# Patient Record
Sex: Male | Born: 1955
Health system: Southern US, Community
[De-identification: ages and names within clinical notes are randomized; demographics above are authoritative.]

## PROBLEM LIST (undated history)

## (undated) DIAGNOSIS — I10 Essential (primary) hypertension: Secondary | ICD-10-CM

## (undated) DIAGNOSIS — F32A Depression, unspecified: Secondary | ICD-10-CM

## (undated) DIAGNOSIS — C439 Malignant melanoma of skin, unspecified: Secondary | ICD-10-CM

## (undated) DIAGNOSIS — F329 Major depressive disorder, single episode, unspecified: Secondary | ICD-10-CM

## (undated) HISTORY — DX: Malignant melanoma of skin, unspecified: C43.9

---

## 2016-06-02 DIAGNOSIS — H2513 Age-related nuclear cataract, bilateral: Secondary | ICD-10-CM | POA: Diagnosis not present

## 2016-06-02 DIAGNOSIS — S00202A Unspecified superficial injury of left eyelid and periocular area, initial encounter: Secondary | ICD-10-CM | POA: Diagnosis not present

## 2016-06-08 DIAGNOSIS — F419 Anxiety disorder, unspecified: Secondary | ICD-10-CM | POA: Diagnosis not present

## 2016-06-08 DIAGNOSIS — I10 Essential (primary) hypertension: Secondary | ICD-10-CM | POA: Diagnosis not present

## 2016-06-08 DIAGNOSIS — H538 Other visual disturbances: Secondary | ICD-10-CM | POA: Diagnosis not present

## 2016-06-24 DIAGNOSIS — I10 Essential (primary) hypertension: Secondary | ICD-10-CM | POA: Diagnosis not present

## 2016-06-24 DIAGNOSIS — R7989 Other specified abnormal findings of blood chemistry: Secondary | ICD-10-CM | POA: Diagnosis not present

## 2016-06-24 DIAGNOSIS — R946 Abnormal results of thyroid function studies: Secondary | ICD-10-CM | POA: Diagnosis not present

## 2016-06-24 DIAGNOSIS — F419 Anxiety disorder, unspecified: Secondary | ICD-10-CM | POA: Diagnosis not present

## 2016-08-07 DIAGNOSIS — I10 Essential (primary) hypertension: Secondary | ICD-10-CM | POA: Diagnosis not present

## 2016-08-07 DIAGNOSIS — R946 Abnormal results of thyroid function studies: Secondary | ICD-10-CM | POA: Diagnosis not present

## 2016-08-07 DIAGNOSIS — R945 Abnormal results of liver function studies: Secondary | ICD-10-CM | POA: Diagnosis not present

## 2016-08-07 DIAGNOSIS — F1010 Alcohol abuse, uncomplicated: Secondary | ICD-10-CM | POA: Diagnosis not present

## 2016-08-07 DIAGNOSIS — F419 Anxiety disorder, unspecified: Secondary | ICD-10-CM | POA: Diagnosis not present

## 2016-09-22 ENCOUNTER — Encounter (HOSPITAL_COMMUNITY): Payer: Self-pay | Admitting: Emergency Medicine

## 2016-09-22 ENCOUNTER — Inpatient Hospital Stay (HOSPITAL_COMMUNITY)
Admission: EM | Admit: 2016-09-22 | Discharge: 2016-09-25 | DRG: 896 | Disposition: A | Discharge status: Home / Self Care | Payer: BLUE CROSS/BLUE SHIELD | Attending: Internal Medicine | Admitting: Internal Medicine

## 2016-09-22 DIAGNOSIS — F32A Depression, unspecified: Secondary | ICD-10-CM | POA: Diagnosis present

## 2016-09-22 DIAGNOSIS — R45851 Suicidal ideations: Secondary | ICD-10-CM

## 2016-09-22 DIAGNOSIS — E860 Dehydration: Secondary | ICD-10-CM

## 2016-09-22 DIAGNOSIS — F329 Major depressive disorder, single episode, unspecified: Secondary | ICD-10-CM | POA: Diagnosis present

## 2016-09-22 DIAGNOSIS — Y908 Blood alcohol level of 240 mg/100 ml or more: Secondary | ICD-10-CM | POA: Diagnosis present

## 2016-09-22 DIAGNOSIS — F10229 Alcohol dependence with intoxication, unspecified: Secondary | ICD-10-CM | POA: Diagnosis not present

## 2016-09-22 DIAGNOSIS — E872 Acidosis: Secondary | ICD-10-CM | POA: Diagnosis present

## 2016-09-22 DIAGNOSIS — F1010 Alcohol abuse, uncomplicated: Secondary | ICD-10-CM | POA: Diagnosis present

## 2016-09-22 DIAGNOSIS — Z653 Problems related to other legal circumstances: Secondary | ICD-10-CM

## 2016-09-22 DIAGNOSIS — Z79899 Other long term (current) drug therapy: Secondary | ICD-10-CM

## 2016-09-22 DIAGNOSIS — M6282 Rhabdomyolysis: Secondary | ICD-10-CM | POA: Diagnosis present

## 2016-09-22 DIAGNOSIS — I10 Essential (primary) hypertension: Secondary | ICD-10-CM | POA: Diagnosis present

## 2016-09-22 DIAGNOSIS — G9340 Encephalopathy, unspecified: Secondary | ICD-10-CM

## 2016-09-22 DIAGNOSIS — G92 Toxic encephalopathy: Secondary | ICD-10-CM | POA: Diagnosis present

## 2016-09-22 DIAGNOSIS — Z811 Family history of alcohol abuse and dependence: Secondary | ICD-10-CM

## 2016-09-22 DIAGNOSIS — Z818 Family history of other mental and behavioral disorders: Secondary | ICD-10-CM

## 2016-09-22 DIAGNOSIS — F419 Anxiety disorder, unspecified: Secondary | ICD-10-CM | POA: Diagnosis present

## 2016-09-22 DIAGNOSIS — F29 Unspecified psychosis not due to a substance or known physiological condition: Secondary | ICD-10-CM | POA: Diagnosis present

## 2016-09-22 DIAGNOSIS — G929 Unspecified toxic encephalopathy: Secondary | ICD-10-CM | POA: Diagnosis present

## 2016-09-22 DIAGNOSIS — F1024 Alcohol dependence with alcohol-induced mood disorder: Secondary | ICD-10-CM | POA: Diagnosis present

## 2016-09-22 DIAGNOSIS — D649 Anemia, unspecified: Secondary | ICD-10-CM | POA: Diagnosis present

## 2016-09-22 HISTORY — DX: Depression, unspecified: F32.A

## 2016-09-22 HISTORY — DX: Major depressive disorder, single episode, unspecified: F32.9

## 2016-09-22 HISTORY — DX: Essential (primary) hypertension: I10

## 2016-09-22 LAB — CBC WITH DIFFERENTIAL/PLATELET
Basophils Absolute: 0.1 10*3/uL (ref 0.0–0.1)
Basophils Relative: 1 %
Eosinophils Absolute: 0 10*3/uL (ref 0.0–0.7)
Eosinophils Relative: 0 %
HCT: 33.8 % — ABNORMAL LOW (ref 39.0–52.0)
Hemoglobin: 11.4 g/dL — ABNORMAL LOW (ref 13.0–17.0)
Lymphocytes Relative: 17 %
Lymphs Abs: 1.4 10*3/uL (ref 0.7–4.0)
MCH: 30.9 pg (ref 26.0–34.0)
MCHC: 33.7 g/dL (ref 30.0–36.0)
MCV: 91.6 fL (ref 78.0–100.0)
Monocytes Absolute: 1.1 10*3/uL — ABNORMAL HIGH (ref 0.1–1.0)
Monocytes Relative: 14 %
Neutro Abs: 5.3 10*3/uL (ref 1.7–7.7)
Neutrophils Relative %: 68 %
Platelets: 197 10*3/uL (ref 150–400)
RBC: 3.69 MIL/uL — ABNORMAL LOW (ref 4.22–5.81)
RDW: 15.3 % (ref 11.5–15.5)
WBC: 7.8 10*3/uL (ref 4.0–10.5)

## 2016-09-22 LAB — COMPREHENSIVE METABOLIC PANEL
ALT: 110 U/L — ABNORMAL HIGH (ref 17–63)
AST: 197 U/L — ABNORMAL HIGH (ref 15–41)
Albumin: 4.3 g/dL (ref 3.5–5.0)
Alkaline Phosphatase: 76 U/L (ref 38–126)
Anion gap: 19 — ABNORMAL HIGH (ref 5–15)
BUN: 10 mg/dL (ref 6–20)
CO2: 16 mmol/L — ABNORMAL LOW (ref 22–32)
Calcium: 8.8 mg/dL — ABNORMAL LOW (ref 8.9–10.3)
Chloride: 104 mmol/L (ref 101–111)
Creatinine, Ser: 0.84 mg/dL (ref 0.61–1.24)
GFR calc Af Amer: >60 mL/min (ref >60)
GFR calc non Af Amer: >60 mL/min (ref >60)
Glucose, Bld: 93 mg/dL (ref 65–99)
Potassium: 3.4 mmol/L — ABNORMAL LOW (ref 3.5–5.1)
Sodium: 139 mmol/L (ref 135–145)
Total Bilirubin: 1.2 mg/dL (ref 0.3–1.2)
Total Protein: 7.5 g/dL (ref 6.5–8.1)

## 2016-09-22 LAB — ETHANOL: Alcohol, Ethyl (B): 327 mg/dL (ref <5)

## 2016-09-22 LAB — SALICYLATE LEVEL: Salicylate Lvl: <7 mg/dL (ref 2.8–30.0)

## 2016-09-22 LAB — CK: Total CK: 1724 U/L — ABNORMAL HIGH (ref 49–397)

## 2016-09-22 LAB — ACETAMINOPHEN LEVEL: Acetaminophen (Tylenol), Serum: <10 ug/mL — ABNORMAL LOW (ref 10–30)

## 2016-09-22 MED ORDER — STERILE WATER FOR INJECTION IJ SOLN
INTRAMUSCULAR | Status: AC
  Administered 2016-09-22: 22:00:00
  Filled 2016-09-22: qty 10

## 2016-09-22 MED ORDER — LORAZEPAM 2 MG/ML IJ SOLN
2.0000 mg | Freq: Once | INTRAMUSCULAR | Status: AC
  Administered 2016-09-22: 2 mg via INTRAMUSCULAR
  Filled 2016-09-22: qty 1

## 2016-09-22 MED ORDER — ZIPRASIDONE MESYLATE 20 MG IM SOLR
10.0000 mg | Freq: Once | INTRAMUSCULAR | Status: AC
  Administered 2016-09-22: 10 mg via INTRAMUSCULAR
  Filled 2016-09-22: qty 20

## 2016-09-22 NOTE — ED Notes (Signed)
Patient is too aggressive at this time to obtain temp or lab work
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
## 2016-09-22 NOTE — ED Notes (Signed)
Bed: WA17 Expected date:  Expected time:  Means of arrival:  Comments: [facility] department
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
## 2016-09-23 ENCOUNTER — Observation Stay (HOSPITAL_COMMUNITY): Payer: BLUE CROSS/BLUE SHIELD

## 2016-09-23 ENCOUNTER — Encounter (HOSPITAL_COMMUNITY): Payer: Self-pay | Admitting: Internal Medicine

## 2016-09-23 DIAGNOSIS — G9340 Encephalopathy, unspecified: Secondary | ICD-10-CM

## 2016-09-23 DIAGNOSIS — F332 Major depressive disorder, recurrent severe without psychotic features: Secondary | ICD-10-CM | POA: Diagnosis not present

## 2016-09-23 DIAGNOSIS — F329 Major depressive disorder, single episode, unspecified: Secondary | ICD-10-CM | POA: Diagnosis present

## 2016-09-23 DIAGNOSIS — M6282 Rhabdomyolysis: Secondary | ICD-10-CM | POA: Diagnosis not present

## 2016-09-23 DIAGNOSIS — R45851 Suicidal ideations: Secondary | ICD-10-CM | POA: Diagnosis not present

## 2016-09-23 DIAGNOSIS — E860 Dehydration: Secondary | ICD-10-CM

## 2016-09-23 DIAGNOSIS — D649 Anemia, unspecified: Secondary | ICD-10-CM | POA: Diagnosis present

## 2016-09-23 DIAGNOSIS — F1024 Alcohol dependence with alcohol-induced mood disorder: Secondary | ICD-10-CM | POA: Diagnosis present

## 2016-09-23 DIAGNOSIS — F32A Depression, unspecified: Secondary | ICD-10-CM | POA: Diagnosis present

## 2016-09-23 DIAGNOSIS — F1010 Alcohol abuse, uncomplicated: Secondary | ICD-10-CM | POA: Diagnosis present

## 2016-09-23 LAB — MRSA PCR SCREENING: MRSA by PCR: NEGATIVE

## 2016-09-23 LAB — URINALYSIS, ROUTINE W REFLEX MICROSCOPIC
Bacteria, UA: NONE SEEN
Bilirubin Urine: NEGATIVE
Glucose, UA: NEGATIVE mg/dL
Ketones, ur: 20 mg/dL — AB
Leukocytes, UA: NEGATIVE
Nitrite: NEGATIVE
Protein, ur: NEGATIVE mg/dL
Specific Gravity, Urine: 1.009 (ref 1.005–1.030)
pH: 5 (ref 5.0–8.0)

## 2016-09-23 LAB — CBC WITH DIFFERENTIAL/PLATELET
Basophils Absolute: 0 10*3/uL (ref 0.0–0.1)
Basophils Relative: 0 %
Eosinophils Absolute: 0 10*3/uL (ref 0.0–0.7)
Eosinophils Relative: 0 %
HCT: 33.5 % — ABNORMAL LOW (ref 39.0–52.0)
Hemoglobin: 11 g/dL — ABNORMAL LOW (ref 13.0–17.0)
Lymphocytes Relative: 23 %
Lymphs Abs: 1.5 10*3/uL (ref 0.7–4.0)
MCH: 30.9 pg (ref 26.0–34.0)
MCHC: 32.8 g/dL (ref 30.0–36.0)
MCV: 94.1 fL (ref 78.0–100.0)
Monocytes Absolute: 0.7 10*3/uL (ref 0.1–1.0)
Monocytes Relative: 10 %
Neutro Abs: 4.2 10*3/uL (ref 1.7–7.7)
Neutrophils Relative %: 67 %
Platelets: 174 10*3/uL (ref 150–400)
RBC: 3.56 MIL/uL — ABNORMAL LOW (ref 4.22–5.81)
RDW: 15.6 % — ABNORMAL HIGH (ref 11.5–15.5)
WBC: 6.4 10*3/uL (ref 4.0–10.5)

## 2016-09-23 LAB — FERRITIN: Ferritin: 960 ng/mL — ABNORMAL HIGH (ref 24–336)

## 2016-09-23 LAB — FOLATE: Folate: 13.7 ng/mL (ref >5.9)

## 2016-09-23 LAB — GLUCOSE, CAPILLARY
Glucose-Capillary: 68 mg/dL (ref 65–99)
Glucose-Capillary: 93 mg/dL (ref 65–99)
Glucose-Capillary: 74 mg/dL (ref 65–99)
Glucose-Capillary: 83 mg/dL (ref 65–99)
Glucose-Capillary: 79 mg/dL (ref 65–99)

## 2016-09-23 LAB — COMPREHENSIVE METABOLIC PANEL
ALT: 106 U/L — ABNORMAL HIGH (ref 17–63)
AST: 179 U/L — ABNORMAL HIGH (ref 15–41)
Albumin: 4 g/dL (ref 3.5–5.0)
Alkaline Phosphatase: 74 U/L (ref 38–126)
Anion gap: 13 (ref 5–15)
BUN: 10 mg/dL (ref 6–20)
CO2: 20 mmol/L — ABNORMAL LOW (ref 22–32)
Calcium: 8.1 mg/dL — ABNORMAL LOW (ref 8.9–10.3)
Chloride: 111 mmol/L (ref 101–111)
Creatinine, Ser: 0.9 mg/dL (ref 0.61–1.24)
GFR calc Af Amer: >60 mL/min (ref >60)
GFR calc non Af Amer: >60 mL/min (ref >60)
Glucose, Bld: 79 mg/dL (ref 65–99)
Potassium: 4 mmol/L (ref 3.5–5.1)
Sodium: 144 mmol/L (ref 135–145)
Total Bilirubin: 0.9 mg/dL (ref 0.3–1.2)
Total Protein: 7.2 g/dL (ref 6.5–8.1)

## 2016-09-23 LAB — CBG MONITORING, ED: Glucose-Capillary: 86 mg/dL (ref 65–99)

## 2016-09-23 LAB — RAPID URINE DRUG SCREEN, HOSP PERFORMED
Amphetamines: NOT DETECTED
Barbiturates: NOT DETECTED
Benzodiazepines: NOT DETECTED
Cocaine: NOT DETECTED
Opiates: NOT DETECTED
Tetrahydrocannabinol: POSITIVE — AB

## 2016-09-23 LAB — RETICULOCYTES
RBC.: 3.64 MIL/uL — ABNORMAL LOW (ref 4.22–5.81)
Retic Count, Absolute: 58.2 10*3/uL (ref 19.0–186.0)
Retic Ct Pct: 1.6 % (ref 0.4–3.1)

## 2016-09-23 LAB — LACTIC ACID, PLASMA: Lactic Acid, Venous: 1.8 mmol/L (ref 0.5–1.9)

## 2016-09-23 LAB — TROPONIN I: Troponin I: <0.03 ng/mL (ref <0.03)

## 2016-09-23 LAB — VITAMIN B12: Vitamin B-12: 338 pg/mL (ref 180–914)

## 2016-09-23 LAB — IRON AND TIBC
Iron: 164 ug/dL (ref 45–182)
Saturation Ratios: 51 % — ABNORMAL HIGH (ref 17.9–39.5)
TIBC: 319 ug/dL (ref 250–450)
UIBC: 155 ug/dL

## 2016-09-23 LAB — CK: Total CK: 1456 U/L — ABNORMAL HIGH (ref 49–397)

## 2016-09-23 LAB — AMMONIA: Ammonia: 15 umol/L (ref 9–35)

## 2016-09-23 LAB — TSH: TSH: 1.798 u[IU]/mL (ref 0.350–4.500)

## 2016-09-23 LAB — HIV ANTIBODY (ROUTINE TESTING W REFLEX): HIV Screen 4th Generation wRfx: NONREACTIVE

## 2016-09-23 MED ORDER — TRAZODONE HCL 50 MG PO TABS
50.0000 mg | ORAL_TABLET | Freq: Every day | ORAL | Status: DC
  Administered 2016-09-24: 50 mg via ORAL
  Filled 2016-09-23 (×2): qty 1

## 2016-09-23 MED ORDER — ADULT MULTIVITAMIN W/MINERALS CH
1.0000 | ORAL_TABLET | Freq: Every day | ORAL | Status: DC
  Administered 2016-09-24 – 2016-09-25 (×2): 1 via ORAL
  Filled 2016-09-23 (×2): qty 1

## 2016-09-23 MED ORDER — ONDANSETRON HCL 4 MG/2ML IJ SOLN: 4.0000 mg | Freq: Four times a day (QID) | INTRAMUSCULAR | Status: DC | PRN

## 2016-09-23 MED ORDER — THIAMINE HCL 100 MG/ML IJ SOLN: 100.0000 mg | Freq: Every day | INTRAMUSCULAR | Status: DC

## 2016-09-23 MED ORDER — DEXTROSE 50 % IV SOLN
INTRAVENOUS | Status: AC
  Filled 2016-09-23: qty 50

## 2016-09-23 MED ORDER — ONDANSETRON HCL 4 MG PO TABS: 4.0000 mg | ORAL_TABLET | Freq: Four times a day (QID) | ORAL | Status: DC | PRN

## 2016-09-23 MED ORDER — METOPROLOL SUCCINATE ER 50 MG PO TB24
50.0000 mg | ORAL_TABLET | Freq: Every day | ORAL | Status: DC
  Administered 2016-09-23 – 2016-09-25 (×3): 50 mg via ORAL
  Filled 2016-09-23 (×2): qty 2
  Filled 2016-09-23: qty 1

## 2016-09-23 MED ORDER — SODIUM CHLORIDE 0.9 % IV BOLUS (SEPSIS)
2000.0000 mL | Freq: Once | INTRAVENOUS | Status: AC
  Administered 2016-09-23: 2000 mL via INTRAVENOUS

## 2016-09-23 MED ORDER — ORAL CARE MOUTH RINSE
15.0000 mL | Freq: Two times a day (BID) | OROMUCOSAL | Status: DC
  Administered 2016-09-23 – 2016-09-24 (×2): 15 mL via OROMUCOSAL

## 2016-09-23 MED ORDER — FOLIC ACID 1 MG PO TABS: 1.0000 mg | ORAL_TABLET | Freq: Every day | ORAL | Status: DC

## 2016-09-23 MED ORDER — FOLIC ACID 1 MG PO TABS
1.0000 mg | ORAL_TABLET | Freq: Every day | ORAL | Status: DC
  Administered 2016-09-24 – 2016-09-25 (×2): 1 mg via ORAL
  Filled 2016-09-23 (×2): qty 1

## 2016-09-23 MED ORDER — ACETAMINOPHEN 325 MG PO TABS
650.0000 mg | ORAL_TABLET | Freq: Four times a day (QID) | ORAL | Status: DC | PRN
  Filled 2016-09-23: qty 2

## 2016-09-23 MED ORDER — CHLORHEXIDINE GLUCONATE 0.12 % MT SOLN
15.0000 mL | Freq: Two times a day (BID) | OROMUCOSAL | Status: DC
  Administered 2016-09-23 – 2016-09-25 (×3): 15 mL via OROMUCOSAL
  Filled 2016-09-23 (×3): qty 15

## 2016-09-23 MED ORDER — LORAZEPAM 2 MG/ML IJ SOLN
1.0000 mg | Freq: Four times a day (QID) | INTRAMUSCULAR | Status: DC | PRN
  Administered 2016-09-23: 1 mg via INTRAVENOUS
  Filled 2016-09-23: qty 1

## 2016-09-23 MED ORDER — ADULT MULTIVITAMIN W/MINERALS CH: 1.0000 | ORAL_TABLET | Freq: Every day | ORAL | Status: DC

## 2016-09-23 MED ORDER — SERTRALINE HCL 100 MG PO TABS
150.0000 mg | ORAL_TABLET | Freq: Every day | ORAL | Status: DC
  Administered 2016-09-23 – 2016-09-25 (×3): 150 mg via ORAL
  Filled 2016-09-23 (×3): qty 1

## 2016-09-23 MED ORDER — VITAMIN B-1 100 MG PO TABS
100.0000 mg | ORAL_TABLET | Freq: Every day | ORAL | Status: DC
  Administered 2016-09-24 – 2016-09-25 (×2): 100 mg via ORAL
  Filled 2016-09-23 (×2): qty 1

## 2016-09-23 MED ORDER — SODIUM CHLORIDE 0.9 % IV SOLN
INTRAVENOUS | Status: DC
  Administered 2016-09-24: 03:00:00 via INTRAVENOUS

## 2016-09-23 MED ORDER — ACETAMINOPHEN 650 MG RE SUPP: 650.0000 mg | Freq: Four times a day (QID) | RECTAL | Status: DC | PRN

## 2016-09-23 MED ORDER — M.V.I. ADULT IV INJ
INJECTION | Freq: Once | INTRAVENOUS | Status: AC
  Administered 2016-09-23: 11:00:00 via INTRAVENOUS
  Filled 2016-09-23: qty 1000

## 2016-09-23 MED ORDER — DEXTROSE 50 % IV SOLN
25.0000 mL | Freq: Once | INTRAVENOUS | Status: AC
  Administered 2016-09-23: 25 mL via INTRAVENOUS

## 2016-09-23 MED ORDER — SODIUM CHLORIDE 0.9 % IV SOLN
INTRAVENOUS | Status: DC
  Administered 2016-09-23: 04:00:00 via INTRAVENOUS

## 2016-09-23 MED ORDER — LORAZEPAM 1 MG PO TABS
1.0000 mg | ORAL_TABLET | Freq: Four times a day (QID) | ORAL | Status: DC | PRN
  Administered 2016-09-24: 1 mg via ORAL
  Filled 2016-09-23: qty 1

## 2016-09-23 MED ORDER — VITAMIN B-1 100 MG PO TABS: 100.0000 mg | ORAL_TABLET | Freq: Every day | ORAL | Status: DC
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
## 2016-09-23 NOTE — Progress Notes (Signed)
Half of D50 amp given for blood glucose of 68 per protocol. Will continue to monitor patient.
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
## 2016-09-23 NOTE — H&P (Signed)
History and Physical    [name] XVQ:[id number] DOB: 03-19-1956 DOA: 09/22/2016  PCP: Patient, No Pcp Per  Patient coming from: Home.  History obtained from ER physician as patient is obtunded at this time. No family at the bedside.  Chief Complaint: Suicidal ideation and agitation.  HPI: Johnny Newton is a 61 y.o. male with history of alcoholism and depression and anxiety was brought to the ER of the patient's sister found the patient was agitated and threatening to shoot himself with suicidal ideation. Patient was walking naked in his yard.   ED Course: In the ER patient was found to be agitated and had been given initially Ativan followed by Geodon 10 mg following which patient became separated. Patient's lab revealed rhabdomyolysis with normocytic normochromic anemia and metabolic acidosis. Alcohol levels were positive. Patient is being admitted for further observation.  Review of Systems: As per HPI, rest all negative.   History reviewed. No pertinent past medical history.  History reviewed. No pertinent surgical history.   has an unknown smoking status. He has never used smokeless tobacco. He reports that he drinks alcohol. He reports that he does not use drugs.  No Known Allergies  Family History  Problem Relation Age of Onset  . Family history unknown: Yes    Prior to Admission medications   Medication Sig Start Date End Date Taking? Authorizing Provider  metoprolol succinate (TOPROL-XL) 50 MG 24 hr tablet Take 1 tablet by mouth daily. 07/03/16  Yes [provider]  sertraline (ZOLOFT) 100 MG tablet Take 150 mg by mouth daily. 07/03/16  Yes [provider]    Physical Exam: Vitals:   09/22/16 2328 09/23/16 0020 09/23/16 0104 09/23/16 0138  BP: 109/65 115/73 104/66 115/77  Pulse: 94 90 86 90  Resp: 20 18 16 20   SpO2: 99% 100% 100% 100%      Constitutional: Moderately built and nourished. Vitals:   09/22/16 2328 09/23/16 0020 09/23/16  0104 09/23/16 0138  BP: 109/65 115/73 104/66 115/77  Pulse: 94 90 86 90  Resp: 20 18 16 20   SpO2: 99% 100% 100% 100%   Eyes: Anicteric no pallor. ENMT: No discharge from the ears eyes nose and mouth. Neck: No neck rigidity no mass felt. Respiratory: No rhonchi or crepitations. Cardiovascular: S1 and S2 heard no murmurs appreciated. Abdomen: Soft nontender bowel sounds present. Musculoskeletal: No edema. No joint effusion. Skin: No rash. Is warm. Neurologic: Patient is up-to-date from receiving medications. Pupils are reacting. Psychiatric: Patient is obtunded.   Labs on Admission: I have personally reviewed following labs and imaging studies  CBC:  Recent Labs Lab 09/22/16 2158  WBC 7.8  NEUTROABS 5.3  HGB 11.4*  HCT 33.8*  MCV 91.6  PLT 093   Basic Metabolic Panel:  Recent Labs Lab 09/22/16 2158  NA 139  K 3.4*  CL 104  CO2 16*  GLUCOSE 93  BUN 10  CREATININE 0.84  CALCIUM 8.8*   GFR: CrCl cannot be calculated (Unknown ideal weight.). Liver Function Tests:  Recent Labs Lab 09/22/16 2158  AST 197*  ALT 110*  ALKPHOS 76  BILITOT 1.2  PROT 7.5  ALBUMIN 4.3   No results for input(s): LIPASE, AMYLASE in the last 168 hours. No results for input(s): AMMONIA in the last 168 hours. Coagulation Profile: No results for input(s): INR, PROTIME in the last 168 hours. Cardiac Enzymes:  Recent Labs Lab 09/22/16 2158  CKTOTAL 1,724*   BNP (last 3 results) No results for input(s): PROBNP in  the last 8760 hours. HbA1C: No results for input(s): HGBA1C in the last 72 hours. CBG: No results for input(s): GLUCAP in the last 168 hours. Lipid Profile: No results for input(s): CHOL, HDL, LDLCALC, TRIG, CHOLHDL, LDLDIRECT in the last 72 hours. Thyroid Function Tests: No results for input(s): TSH, T4TOTAL, FREET4, T3FREE, THYROIDAB in the last 72 hours. Anemia Panel: No results for input(s): VITAMINB12, FOLATE, FERRITIN, TIBC, IRON, RETICCTPCT in the last 72  hours. Urine analysis: No results found for: COLORURINE, APPEARANCEUR, LABSPEC, PHURINE, GLUCOSEU, HGBUR, BILIRUBINUR, KETONESUR, PROTEINUR, UROBILINOGEN, NITRITE, LEUKOCYTESUR Sepsis Labs: @LABRCNTIP (procalcitonin:4,lacticidven:4) )No results found for this or any previous visit (from the past 240 hour(s)).   Radiological Exams on Admission: No results found.   Assessment/Plan Principal Problem:   Acute encephalopathy Active Problems:   Suicidal ideation   Rhabdomyolysis   Normochromic normocytic anemia    1. Acute encephalopathy/psychosis with suicidal ideation - patient has received Geodon following which patient has become sedated. For now will closely observe the stepdown unit. Consult psychiatrist in a.m. Need to get further history from family when available. 2. Rhabdomyolysis - hydrate him closely follow CK levels. UA is pending. 3. Normocytic normochromic anemia - check anemia panel and follow CBC. Not sure if patient has had previous colonoscopy or not. Need to discuss with family. 4. Alcohol abuse - patient has been placed on CIWA protocol.   DVT prophylaxis: SCDs. Code Status: Full code.  Family Communication: No family at the bedside.  Disposition Plan: To be determined.  Consults called: None.  Admission status: Observation.    Rise Patience MD [facility] Pager [phone].  If 7PM-7AM, please contact night-coverage [email or web address] Password [email or web address]  09/23/2016, 2:00 AM

## 2016-09-23 NOTE — Progress Notes (Signed)
Blood glucose 93 when rechecked (15 mins after 1/2 D50 amp admistered).

## 2016-09-23 NOTE — Care Management Note (Signed)
Case Management Note  Patient Details  Name: Johnny Newton MRN: [id number] Date of Birth: [birth year]  Subjective/Objective:                  61 y.o. male with history of alcoholism and depression and anxiety was brought to the ER of the patient's sister found the patient was agitated and threatening to shoot himself with suicidal ideation. Patient was walking naked in his yard.   ED Course: In the ER patient was found to be agitated and had been given initially Ativan followed by Geodon 10 mg following which patient became separated. Patient's lab revealed rhabdomyolysis with normocytic normochromic anemia and metabolic acidosis. Alcohol levels were positive. Patient is being admitted for further observation.  Action/Plan: Date:  September 23, 2016 Chart reviewed for concurrent status and case management needs. Will continue to follow patient progress. Discharge Planning: following for needs Expected discharge date: [id number] Velva Harman, BSN, [place], [place]  Expected Discharge Date:                  Expected Discharge Plan:  Home/Self Care  In-House Referral:     Discharge planning Services  CM Consult  Post Acute Care Choice:    Choice offered to:     DME Arranged:    DME Agency:     HH Arranged:    HH Agency:     Status of Service:  In process, will continue to follow  If discussed at Long Length of Stay Meetings, dates discussed:    Additional Comments:  Leeroy Cha, RN 09/23/2016, 8:47 AM

## 2016-09-24 DIAGNOSIS — F1910 Other psychoactive substance abuse, uncomplicated: Secondary | ICD-10-CM | POA: Diagnosis not present

## 2016-09-24 DIAGNOSIS — M6282 Rhabdomyolysis: Secondary | ICD-10-CM | POA: Diagnosis present

## 2016-09-24 DIAGNOSIS — F329 Major depressive disorder, single episode, unspecified: Secondary | ICD-10-CM

## 2016-09-24 DIAGNOSIS — Y908 Blood alcohol level of 240 mg/100 ml or more: Secondary | ICD-10-CM | POA: Diagnosis present

## 2016-09-24 DIAGNOSIS — I10 Essential (primary) hypertension: Secondary | ICD-10-CM | POA: Diagnosis present

## 2016-09-24 DIAGNOSIS — F1010 Alcohol abuse, uncomplicated: Secondary | ICD-10-CM | POA: Diagnosis not present

## 2016-09-24 DIAGNOSIS — R45851 Suicidal ideations: Secondary | ICD-10-CM | POA: Diagnosis present

## 2016-09-24 DIAGNOSIS — G929 Unspecified toxic encephalopathy: Secondary | ICD-10-CM | POA: Diagnosis present

## 2016-09-24 DIAGNOSIS — F332 Major depressive disorder, recurrent severe without psychotic features: Secondary | ICD-10-CM | POA: Diagnosis not present

## 2016-09-24 DIAGNOSIS — F331 Major depressive disorder, recurrent, moderate: Secondary | ICD-10-CM | POA: Diagnosis not present

## 2016-09-24 DIAGNOSIS — Z653 Problems related to other legal circumstances: Secondary | ICD-10-CM | POA: Diagnosis not present

## 2016-09-24 DIAGNOSIS — E860 Dehydration: Secondary | ICD-10-CM

## 2016-09-24 DIAGNOSIS — Z811 Family history of alcohol abuse and dependence: Secondary | ICD-10-CM | POA: Diagnosis not present

## 2016-09-24 DIAGNOSIS — F339 Major depressive disorder, recurrent, unspecified: Secondary | ICD-10-CM | POA: Diagnosis not present

## 2016-09-24 DIAGNOSIS — G4700 Insomnia, unspecified: Secondary | ICD-10-CM | POA: Diagnosis not present

## 2016-09-24 DIAGNOSIS — G92 Toxic encephalopathy: Secondary | ICD-10-CM | POA: Diagnosis present

## 2016-09-24 DIAGNOSIS — F419 Anxiety disorder, unspecified: Secondary | ICD-10-CM | POA: Diagnosis present

## 2016-09-24 DIAGNOSIS — F1024 Alcohol dependence with alcohol-induced mood disorder: Secondary | ICD-10-CM | POA: Diagnosis not present

## 2016-09-24 DIAGNOSIS — F29 Unspecified psychosis not due to a substance or known physiological condition: Secondary | ICD-10-CM | POA: Diagnosis present

## 2016-09-24 DIAGNOSIS — G9340 Encephalopathy, unspecified: Secondary | ICD-10-CM | POA: Diagnosis not present

## 2016-09-24 DIAGNOSIS — Z818 Family history of other mental and behavioral disorders: Secondary | ICD-10-CM | POA: Diagnosis not present

## 2016-09-24 DIAGNOSIS — E872 Acidosis: Secondary | ICD-10-CM | POA: Diagnosis present

## 2016-09-24 DIAGNOSIS — D649 Anemia, unspecified: Secondary | ICD-10-CM | POA: Diagnosis present

## 2016-09-24 DIAGNOSIS — F10229 Alcohol dependence with intoxication, unspecified: Secondary | ICD-10-CM | POA: Diagnosis present

## 2016-09-24 DIAGNOSIS — Z79899 Other long term (current) drug therapy: Secondary | ICD-10-CM | POA: Diagnosis not present

## 2016-09-24 LAB — BASIC METABOLIC PANEL
Anion gap: 17 — ABNORMAL HIGH (ref 5–15)
BUN: 8 mg/dL (ref 6–20)
CO2: 9 mmol/L — ABNORMAL LOW (ref 22–32)
Calcium: 8.2 mg/dL — ABNORMAL LOW (ref 8.9–10.3)
Chloride: 109 mmol/L (ref 101–111)
Creatinine, Ser: 1.07 mg/dL (ref 0.61–1.24)
GFR calc Af Amer: >60 mL/min (ref >60)
GFR calc non Af Amer: >60 mL/min (ref >60)
Glucose, Bld: 81 mg/dL (ref 65–99)
Potassium: 4.4 mmol/L (ref 3.5–5.1)
Sodium: 135 mmol/L (ref 135–145)

## 2016-09-24 LAB — HEPATITIS PANEL, ACUTE
HCV Ab: <0.1 s/co ratio (ref 0.0–0.9)
Hep A IgM: NEGATIVE
Hep B C IgM: NEGATIVE
Hepatitis B Surface Ag: NEGATIVE

## 2016-09-24 LAB — CBC
HCT: 34.7 % — ABNORMAL LOW (ref 39.0–52.0)
Hemoglobin: 11 g/dL — ABNORMAL LOW (ref 13.0–17.0)
MCH: 30.5 pg (ref 26.0–34.0)
MCHC: 31.7 g/dL (ref 30.0–36.0)
MCV: 96.1 fL (ref 78.0–100.0)
Platelets: 191 10*3/uL (ref 150–400)
RBC: 3.61 MIL/uL — ABNORMAL LOW (ref 4.22–5.81)
RDW: 15.7 % — ABNORMAL HIGH (ref 11.5–15.5)
WBC: 9 10*3/uL (ref 4.0–10.5)

## 2016-09-24 LAB — GLUCOSE, CAPILLARY
Glucose-Capillary: 90 mg/dL (ref 65–99)
Glucose-Capillary: 106 mg/dL — ABNORMAL HIGH (ref 65–99)

## 2016-09-24 LAB — CK: Total CK: 984 U/L — ABNORMAL HIGH (ref 49–397)

## 2016-09-24 LAB — MAGNESIUM: Magnesium: 2.2 mg/dL (ref 1.7–2.4)

## 2016-09-24 MED ORDER — CALCIUM CARBONATE ANTACID 500 MG PO CHEW: 1.0000 | CHEWABLE_TABLET | Freq: Three times a day (TID) | ORAL | Status: DC | PRN

## 2016-09-24 NOTE — Progress Notes (Signed)
PROGRESS NOTE    Johnny Newton  MHD:[id number] DOB: 1956/01/29 DOA: 09/22/2016 PCP: Patient, No Pcp Per    Brief Narrative: 61 yo male presented with agitation and suicidal ideation. Patient known to have depression and to abuse alcohol. His family member found him agitated and threatening to end his life. On the initial examination after 10 mg of Geodon, blood pressure was 109/65, HR 94, RR 20 and oxygen saturation 99%. Lungs were clear to auscultation, heart S1 and S2 present and rhythmic, abdomen soft and non tender, lower extremity with no edema. Patient wad admitted to the step down unit with metabolic/ toxic encephalopathy.    Assessment & Plan:   Principal Problem:   Alcohol dependence with alcohol-induced mood disorder (HCC) Active Problems:   Acute encephalopathy   Suicidal ideation   Rhabdomyolysis   Normochromic normocytic anemia   ETOH abuse   Depression   Dehydration   1. Metabolic/ toxic encephalopathy. Patient more awake and alert, will continue benzodiazepines per protocol, neuro checks per unit protocol, will transfer to medical unit. Physical therapy evaluation.   2. Suicidal ideation. Will continue one to one sitter, will follow on psych recommendations, patient is IVC and will go to psych when medically stable. Continue sertraline and trazodone.  3. Rhabdomyolysis. CPK continue to trend down, renal function stable, will follow on renal panel in am.   4. Anemia. Stable, no signs of bleeding.   5. Etho abuse. Continue benzodiazepines per protocol.   6. Dehydration. Patient tolerating well po.   7. HTN. Will continue metoprolol.    DVT prophylaxis: scd Code Status: full  Family Communication:  Disposition Plan:    Consultants:   Psych  Procedures:   Antimicrobials:   Subjective: Improved tremors, no nausea or vomiting. Patient anxious to be discharged. No chest pain.   Objective: Vitals:   09/24/16 0428 09/24/16 0500 09/24/16 0600 09/24/16  0618  BP:   126/63   Pulse:  79 79   Resp:  19 20   Temp: 98.6 F (37 C)     TempSrc: Oral     SpO2:  100% 100%   Weight:    74.2 kg (163 lb 9.3 oz)  Height:        Intake/Output Summary (Last 24 hours) at 09/24/16 0817 Last data filed at 09/24/16 0600  Gross per 24 hour  Intake          3769.17 ml  Output              800 ml  Net          2969.17 ml   Filed Weights   09/23/16 0922 09/24/16 0618  Weight: 72.4 kg (159 lb 9.8 oz) 74.2 kg (163 lb 9.3 oz)    Examination:  General exam: Deconditioned E ENT. No pallor or icterus.  Respiratory system: Clear to auscultation. Respiratory effort normal. Cardiovascular system: S1 & S2 heard, RRR. No JVD, murmurs, rubs, gallops or clicks. No pedal edema. Gastrointestinal system: Abdomen is nondistended, soft and nontender. No organomegaly or masses felt. Normal bowel sounds heard. Central nervous system: Alert and oriented. No focal neurological deficits. Positive resting tremor bilateral hands.  Extremities: Symmetric 5 x 5 power. Skin: No rashes, lesions or ulcers     Data Reviewed: I have personally reviewed following labs and imaging studies  CBC:  Recent Labs Lab 09/22/16 2158 09/23/16 0351 09/24/16 0308  WBC 7.8 6.4 9.0  NEUTROABS 5.3 4.2  --   HGB 11.4* 11.0* 11.0*  HCT 33.8* 33.5* 34.7*  MCV 91.6 94.1 96.1  PLT 197 174 852   Basic Metabolic Panel:  Recent Labs Lab 09/22/16 2158 09/23/16 0351 09/24/16 0308  NA 139 144 135  K 3.4* 4.0 4.4  CL 104 111 109  CO2 16* 20* 9*  GLUCOSE 93 79 81  BUN 10 10 8   CREATININE 0.84 0.90 1.07  CALCIUM 8.8* 8.1* 8.2*  MG  --   --  2.2   GFR: Estimated Creatinine Clearance: 71.9 mL/min (by C-G formula based on SCr of 1.07 mg/dL). Liver Function Tests:  Recent Labs Lab 09/22/16 2158 09/23/16 0351  AST 197* 179*  ALT 110* 106*  ALKPHOS 76 74  BILITOT 1.2 0.9  PROT 7.5 7.2  ALBUMIN 4.3 4.0   No results for input(s): LIPASE, AMYLASE in the last 168  hours.  Recent Labs Lab 09/23/16 0351  AMMONIA 15   Coagulation Profile: No results for input(s): INR, PROTIME in the last 168 hours. Cardiac Enzymes:  Recent Labs Lab 09/22/16 2158 09/23/16 0351 09/24/16 0308  CKTOTAL 1,724* 1,456* 984*  TROPONINI  --  <0.03  --    BNP (last 3 results) No results for input(s): PROBNP in the last 8760 hours. HbA1C: No results for input(s): HGBA1C in the last 72 hours. CBG:  Recent Labs Lab 09/23/16 0645 09/23/16 1224 09/23/16 1732 09/23/16 2330 09/24/16 0541  GLUCAP 93 74 83 79 90   Lipid Profile: No results for input(s): CHOL, HDL, LDLCALC, TRIG, CHOLHDL, LDLDIRECT in the last 72 hours. Thyroid Function Tests:  Recent Labs  09/23/16 0351  TSH 1.798   Anemia Panel:  Recent Labs  09/23/16 0816  VITAMINB12 338  FOLATE 13.7  FERRITIN 960*  TIBC 319  IRON 164  RETICCTPCT 1.6   Sepsis Labs:  Recent Labs Lab 09/23/16 0351  LATICACIDVEN 1.8    Recent Results (from the past 240 hour(s))  MRSA PCR Screening     Status: None   Collection Time: 09/23/16  2:56 AM  Result Value Ref Range Status   MRSA by PCR NEGATIVE NEGATIVE Final    Comment:        The GeneXpert MRSA Assay (FDA approved for NASAL specimens only), is one component of a comprehensive MRSA colonization surveillance program. It is not intended to diagnose MRSA infection nor to guide or monitor treatment for MRSA infections.          Radiology Studies: Ct Head Wo Contrast  Result Date: 09/23/2016 CLINICAL DATA:  Acute encephalopathy. EXAM: CT HEAD WITHOUT CONTRAST TECHNIQUE: Contiguous axial images were obtained from the base of the skull through the vertex without intravenous contrast. COMPARISON:  None. FINDINGS: Brain: Mild generalized atrophy. No intracranial hemorrhage, evidence of acute infarct, hydrocephalus or extra-axial fluid collection. There is a tiny 5 mm peripherally calcified extra-axial density in the left frontal region without  mass effect or midline shift. Vascular: No hyperdense vessel or unexpected calcification. Skull: Normal. Negative for fracture or focal lesion. Sinuses/Orbits: Paranasal sinuses and mastoid air cells are clear. The visualized orbits are unremarkable. Other: None. IMPRESSION: 1.  No acute intracranial abnormality.  Mild generalized atrophy. 2. Tiny 5 mm calcified extra-axial density in the left frontal region may be dural calcification or a tiny meningioma. There is no mass effect. This is of doubtful clinical significance. Electronically Signed   By: Jeb Levering M.D.   On: 09/23/2016 03:37        Scheduled Meds: . chlorhexidine  15 mL Mouth Rinse BID  .  folic acid  1 mg Oral Daily  . mouth rinse  15 mL Mouth Rinse q12n4p  . metoprolol succinate  50 mg Oral Daily  . multivitamin with minerals  1 tablet Oral Daily  . sertraline  150 mg Oral Daily  . thiamine  100 mg Oral Daily   Or  . thiamine  100 mg Intravenous Daily  . traZODone  50 mg Oral QHS   Continuous Infusions: . sodium chloride 125 mL/hr at 09/24/16 0600     LOS: 0 days        Malacai Grantz Gerome Apley, MD [facility] Pager [phone]  If 7PM-7AM, please contact night-coverage [email or web address] Password [facility] 09/24/2016, 8:17 AM
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
## 2016-09-24 NOTE — Progress Notes (Signed)
CSW and Psychiatrist met with patient at bedside, inquired about his wellbeing. Patient reports, " I am feeling fine, better than I have in a long time." Patient reports he talk with the sitters at bedside last night and felt they were very comforting,and listen to him. Patient reports he was able to talk with his spouse this morning and learn she will visit with him today. Patient requests and feels he ready to go home. Psychiatrist explain patient will need to continue treatment at inpatient  behavioral health hospital before going home. Patient not agreeable and wants family to discuss plan. He reports, they will visit at 2:00pm today. CSW will make self available if needed.   Kathrin Greathouse, Latanya Presser, MSW Clinical Social Worker 5E and Psychiatric Service Line [phone] 09/24/2016  1:21 PM

## 2016-09-25 ENCOUNTER — Encounter (HOSPITAL_COMMUNITY): Payer: Self-pay | Admitting: *Deleted

## 2016-09-25 ENCOUNTER — Inpatient Hospital Stay (HOSPITAL_COMMUNITY)
Admission: AD | Admit: 2016-09-25 | Discharge: 2016-09-29 | DRG: 881 | Disposition: A | Discharge status: Home / Self Care | Payer: BLUE CROSS/BLUE SHIELD | Attending: Psychiatry | Admitting: Psychiatry

## 2016-09-25 DIAGNOSIS — I10 Essential (primary) hypertension: Secondary | ICD-10-CM | POA: Diagnosis present

## 2016-09-25 DIAGNOSIS — F10229 Alcohol dependence with intoxication, unspecified: Secondary | ICD-10-CM | POA: Diagnosis present

## 2016-09-25 DIAGNOSIS — F329 Major depressive disorder, single episode, unspecified: Principal | ICD-10-CM | POA: Diagnosis present

## 2016-09-25 DIAGNOSIS — F339 Major depressive disorder, recurrent, unspecified: Secondary | ICD-10-CM | POA: Diagnosis not present

## 2016-09-25 DIAGNOSIS — F1910 Other psychoactive substance abuse, uncomplicated: Secondary | ICD-10-CM | POA: Diagnosis not present

## 2016-09-25 DIAGNOSIS — F1220 Cannabis dependence, uncomplicated: Secondary | ICD-10-CM | POA: Diagnosis present

## 2016-09-25 DIAGNOSIS — Z79899 Other long term (current) drug therapy: Secondary | ICD-10-CM

## 2016-09-25 DIAGNOSIS — E860 Dehydration: Secondary | ICD-10-CM | POA: Diagnosis not present

## 2016-09-25 DIAGNOSIS — F1024 Alcohol dependence with alcohol-induced mood disorder: Secondary | ICD-10-CM | POA: Diagnosis not present

## 2016-09-25 DIAGNOSIS — M6282 Rhabdomyolysis: Secondary | ICD-10-CM | POA: Diagnosis not present

## 2016-09-25 DIAGNOSIS — D649 Anemia, unspecified: Secondary | ICD-10-CM | POA: Diagnosis not present

## 2016-09-25 DIAGNOSIS — F1020 Alcohol dependence, uncomplicated: Secondary | ICD-10-CM | POA: Diagnosis present

## 2016-09-25 DIAGNOSIS — G4700 Insomnia, unspecified: Secondary | ICD-10-CM | POA: Diagnosis not present

## 2016-09-25 DIAGNOSIS — G92 Toxic encephalopathy: Secondary | ICD-10-CM | POA: Diagnosis present

## 2016-09-25 DIAGNOSIS — R45851 Suicidal ideations: Secondary | ICD-10-CM | POA: Diagnosis present

## 2016-09-25 DIAGNOSIS — F331 Major depressive disorder, recurrent, moderate: Secondary | ICD-10-CM | POA: Diagnosis not present

## 2016-09-25 DIAGNOSIS — G9340 Encephalopathy, unspecified: Secondary | ICD-10-CM | POA: Diagnosis not present

## 2016-09-25 DIAGNOSIS — F1010 Alcohol abuse, uncomplicated: Secondary | ICD-10-CM | POA: Diagnosis not present

## 2016-09-25 LAB — BASIC METABOLIC PANEL
Anion gap: 13 (ref 5–15)
BUN: 10 mg/dL (ref 6–20)
CO2: 14 mmol/L — ABNORMAL LOW (ref 22–32)
Calcium: 9.5 mg/dL (ref 8.9–10.3)
Chloride: 111 mmol/L (ref 101–111)
Creatinine, Ser: 0.9 mg/dL (ref 0.61–1.24)
GFR calc Af Amer: >60 mL/min (ref >60)
GFR calc non Af Amer: >60 mL/min (ref >60)
Glucose, Bld: 95 mg/dL (ref 65–99)
Potassium: 3.7 mmol/L (ref 3.5–5.1)
Sodium: 138 mmol/L (ref 135–145)

## 2016-09-25 MED ORDER — LOPERAMIDE HCL 2 MG PO CAPS: 2.0000 mg | ORAL_CAPSULE | ORAL | Status: AC | PRN

## 2016-09-25 MED ORDER — SERTRALINE HCL 50 MG PO TABS
150.0000 mg | ORAL_TABLET | Freq: Every day | ORAL | Status: DC
  Administered 2016-09-26 – 2016-09-29 (×4): 150 mg via ORAL
  Filled 2016-09-25 (×6): qty 1

## 2016-09-25 MED ORDER — ACETAMINOPHEN 325 MG PO TABS: 650.0000 mg | ORAL_TABLET | Freq: Four times a day (QID) | ORAL | Status: DC | PRN

## 2016-09-25 MED ORDER — TRAZODONE HCL 50 MG PO TABS: 50.0000 mg | ORAL_TABLET | Freq: Every day | ORAL | 0 refills | Status: DC

## 2016-09-25 MED ORDER — THIAMINE HCL 100 MG/ML IJ SOLN
100.0000 mg | Freq: Once | INTRAMUSCULAR | Status: AC
  Administered 2016-09-26: 100 mg via INTRAMUSCULAR
  Filled 2016-09-25: qty 2

## 2016-09-25 MED ORDER — ONDANSETRON 4 MG PO TBDP: 4.0000 mg | ORAL_TABLET | Freq: Four times a day (QID) | ORAL | Status: AC | PRN

## 2016-09-25 MED ORDER — CALCIUM CARBONATE ANTACID 500 MG PO CHEW: 1.0000 | CHEWABLE_TABLET | Freq: Three times a day (TID) | ORAL | Status: DC | PRN

## 2016-09-25 MED ORDER — LORAZEPAM 1 MG PO TABS
1.0000 mg | ORAL_TABLET | Freq: Three times a day (TID) | ORAL | Status: AC
  Administered 2016-09-27 (×3): 1 mg via ORAL
  Filled 2016-09-25 (×3): qty 1

## 2016-09-25 MED ORDER — LORAZEPAM 1 MG PO TABS
1.0000 mg | ORAL_TABLET | Freq: Four times a day (QID) | ORAL | Status: AC | PRN
  Administered 2016-09-27: 1 mg via ORAL
  Filled 2016-09-25: qty 1

## 2016-09-25 MED ORDER — METOPROLOL SUCCINATE ER 50 MG PO TB24
50.0000 mg | ORAL_TABLET | Freq: Every day | ORAL | Status: DC
  Administered 2016-09-26 – 2016-09-29 (×4): 50 mg via ORAL
  Filled 2016-09-25 (×6): qty 1

## 2016-09-25 MED ORDER — LORAZEPAM 1 MG PO TABS
1.0000 mg | ORAL_TABLET | Freq: Two times a day (BID) | ORAL | Status: AC
  Administered 2016-09-28 (×2): 1 mg via ORAL
  Filled 2016-09-25 (×2): qty 1

## 2016-09-25 MED ORDER — MAGNESIUM HYDROXIDE 400 MG/5ML PO SUSP: 30.0000 mL | Freq: Every day | ORAL | Status: DC | PRN

## 2016-09-25 MED ORDER — TRAZODONE HCL 50 MG PO TABS
50.0000 mg | ORAL_TABLET | Freq: Every day | ORAL | Status: DC
  Administered 2016-09-25 – 2016-09-27 (×3): 50 mg via ORAL
  Filled 2016-09-25 (×7): qty 1

## 2016-09-25 MED ORDER — HYDROXYZINE HCL 25 MG PO TABS: 25.0000 mg | ORAL_TABLET | Freq: Three times a day (TID) | ORAL | Status: DC | PRN

## 2016-09-25 MED ORDER — ADULT MULTIVITAMIN W/MINERALS CH
1.0000 | ORAL_TABLET | Freq: Every day | ORAL | Status: DC
  Administered 2016-09-25 – 2016-09-29 (×5): 1 via ORAL
  Filled 2016-09-25 (×7): qty 1

## 2016-09-25 MED ORDER — LORAZEPAM 1 MG PO TABS
1.0000 mg | ORAL_TABLET | Freq: Four times a day (QID) | ORAL | Status: AC
  Administered 2016-09-25 – 2016-09-26 (×6): 1 mg via ORAL
  Filled 2016-09-25 (×6): qty 1

## 2016-09-25 MED ORDER — LORAZEPAM 1 MG PO TABS
1.0000 mg | ORAL_TABLET | Freq: Every day | ORAL | Status: AC
  Administered 2016-09-29: 1 mg via ORAL
  Filled 2016-09-25: qty 1

## 2016-09-25 MED ORDER — VITAMIN B-1 100 MG PO TABS
100.0000 mg | ORAL_TABLET | Freq: Every day | ORAL | Status: DC
  Administered 2016-09-26 – 2016-09-29 (×4): 100 mg via ORAL
  Filled 2016-09-25 (×6): qty 1

## 2016-09-25 MED ORDER — ALUM & MAG HYDROXIDE-SIMETH 200-200-20 MG/5ML PO SUSP: 30.0000 mL | ORAL | Status: DC | PRN

## 2016-09-25 NOTE — Tx Team (Signed)
Initial Treatment Plan 09/25/2016 6:26 PM Johnny Newton [id number]    PATIENT STRESSORS: Educational concerns Financial difficulties Health problems Legal issue   PATIENT STRENGTHS: Ability for insight Active sense of humor Average or above average intelligence   PATIENT IDENTIFIED PROBLEMS: Alcoholism     Depression        " Will I have visitors"  " Thank you for your help"       DISCHARGE CRITERIA:  Ability to meet basic life and health needs Adequate post-discharge living arrangements Improved stabilization in mood, thinking, and/or behavior Medical problems require only outpatient monitoring  PRELIMINARY DISCHARGE PLAN: Attend aftercare/continuing care group Attend PHP/IOP Attend 12-step recovery group Outpatient therapy  PATIENT/FAMILY INVOLVEMENT: This treatment plan has been presented to and reviewed with the patient, Johnny Newton, and/or family member, The patient and family have been given the opportunity to ask questions and make suggestions.  Lauralyn Primes, RN 09/25/2016, 6:26 PM

## 2016-09-25 NOTE — Progress Notes (Signed)
Report called to [facility]. Report called to [place], Therapist, sports. [employer] transporting pt to behavorial health. IVC papers sent with pt. Pt stable at this time.

## 2016-09-25 NOTE — Progress Notes (Signed)
CSW met with patient spouse and sister in family conference room, per patient request. CSW explain patient discharge plan and options at discharge inpatient psychiatric hospital. Patient family is hopeful patient can go to [facility] for treatment. If not family prefers [place] so that patient can be close to family. CSW discussed rehab options and answered questions. Patient family reports, " we have started the process looking into facilities that he may like and is agreeable to go." CSW provided actively listening and emotional support. Patient family appreciative of CSW services.  Plan: CSW to assist with discharge to [facility], once medically stable.   Kathrin Greathouse, Latanya Presser, MSW Clinical Social Worker [place] [facility] Psychiatric Service Line [phone]

## 2016-09-25 NOTE — Discharge Summary (Signed)
Physician Discharge Summary  Johnny Newton [id number]:[id number] DOB: 10/07/1955 DOA: 09/22/2016  PCP: Patient, No Pcp Per  Admit date: 09/22/2016 Discharge date: 09/25/2016  Admitted From: Home  Disposition:  Psych unit  Recommendations for Outpatient Follow-up:  1. Follow up with PCP in 1- week   Home Health: Home  Equipment/Devices: Home   Discharge Condition: Stable  CODE STATUS: Full  Diet recommendation: Heart Healthy    Brief/Interim Summary: 61 yo male presented with agitation and suicidal ideation. Patient known to have depression and to abuse alcohol. His family member found him agitated and threatening to end his life. On the initial examination after 10 mg of Geodon, blood pressure was 109/65, HR 94, RR 20 and oxygen saturation 99%. Lungs were clear to auscultation, heart S1 and S2 present and rhythmic, abdomen soft and non tender, lower extremity with no edema. Sodium 139, potassium 3.4, chloride 104, bicarbonate 16, glucose 93, BUN 10, creatinine 0.84, white count 7.8, hemoglobin 11.4, hematocrit 33.8, platelets 197. Alcohol 327. CT head no acute intracranial abnormality, mild generalized atrophy, tiny 5 mm calcified extra-axial density in the left frontal region may be due to calcification or tiny meningioma. Urine analysis negative for infection. Urine drug screen positive for tetrahydrocannabinol.   Patient wad admitted to the step down unit with metabolic/ toxic encephalopathy.   1. Toxic encephalopathy, alcohol intoxication, suicidal ideation. Patient was admitted to the stepdown unit, frequent neuro checks, he was placed on benzodiazepine per protocol to prevent withdrawal. Patient responded well to medical therapy. Patient remained with no significant agitation or confusion, patient was seen by psychiatry with recommendations to continue Zoloft 150 g daily, trazodone 50 g at bedtime as needed. Patient will be discharged to a psychiatric inpatient facility. Patient was placed on  involuntary commitment to assure patient's safety.   2. Rhabdomyolysis. Patient received supportive medical therapy with intravenous fluids, admission CPK 1724, June 7 CPK 984, kidney function remained stable with a creatinine between 0.9 and 1.0, serum bicarbonate at 14 increased from 9. Patient tolerated by mouth diet adequately.  3. Alcohol abuse. Patient received as needed benzodiazepines, good toleration, patient discharged to inpatient psychiatric facility.   4. Hypertension. Patient continue metoprolol. Systolic blood pressure by the time of discharge 116.  Discharge Diagnoses:  Principal Problem:   Alcohol dependence with alcohol-induced mood disorder (HCC) Active Problems:   Acute encephalopathy   Suicidal ideation   Rhabdomyolysis   Normochromic normocytic anemia   ETOH abuse   Depression   Dehydration   Toxic encephalopathy    Discharge Instructions   Allergies as of 09/25/2016   No Known Allergies     Medication List    TAKE these medications   metoprolol succinate 50 MG 24 hr tablet Commonly known as:  TOPROL-XL Take 1 tablet by mouth daily.   sertraline 100 MG tablet Commonly known as:  ZOLOFT Take 150 mg by mouth daily.   traZODone 50 MG tablet Commonly known as:  DESYREL Take 1 tablet (50 mg total) by mouth at bedtime.       No Known Allergies  Consultations:  Psychiatry   Procedures/Studies: Ct Head Wo Contrast  Result Date: 09/23/2016 CLINICAL DATA:  Acute encephalopathy. EXAM: CT HEAD WITHOUT CONTRAST TECHNIQUE: Contiguous axial images were obtained from the base of the skull through the vertex without intravenous contrast. COMPARISON:  None. FINDINGS: Brain: Mild generalized atrophy. No intracranial hemorrhage, evidence of acute infarct, hydrocephalus or extra-axial fluid collection. There is a tiny 5 mm peripherally calcified extra-axial density in  the left frontal region without mass effect or midline shift. Vascular: No hyperdense vessel  or unexpected calcification. Skull: Normal. Negative for fracture or focal lesion. Sinuses/Orbits: Paranasal sinuses and mastoid air cells are clear. The visualized orbits are unremarkable. Other: None. IMPRESSION: 1.  No acute intracranial abnormality.  Mild generalized atrophy. 2. Tiny 5 mm calcified extra-axial density in the left frontal region may be dural calcification or a tiny meningioma. There is no mass effect. This is of doubtful clinical significance. Electronically Signed   By: Jeb Levering M.D.   On: 09/23/2016 03:37      Subjective: Patient feeling well, no nausea, no vomiting, no chest pain or shortness of breath. No agitation or confusion.  Discharge Exam: Vitals:   09/25/16 1009 09/25/16 1047  BP: (!) 146/81 116/80  Pulse: 92 88  Resp:    Temp:     Vitals:   09/24/16 2353 09/25/16 0624 09/25/16 1009 09/25/16 1047  BP: 129/81 121/78 (!) 146/81 116/80  Pulse: 90 84 92 88  Resp: 20 20    Temp: 98 F (36.7 C) 98.2 F (36.8 C)    TempSrc: Oral Oral    SpO2: 100% 99%    Weight:      Height:        General: Pt is alert, awake, not in acute distress E ENT. No pallor or icterus.  Cardiovascular: RRR, S1/S2 +, no rubs, no gallops Respiratory: CTA bilaterally, no wheezing, no rhonchi Abdominal: Soft, NT, ND, bowel sounds + Extremities: no edema, no cyanosis    The results of significant diagnostics from this hospitalization (including imaging, microbiology, ancillary and laboratory) are listed below for reference.     Microbiology: Recent Results (from the past 240 hour(s))  MRSA PCR Screening     Status: None   Collection Time: 09/23/16  2:56 AM  Result Value Ref Range Status   MRSA by PCR NEGATIVE NEGATIVE Final    Comment:        The GeneXpert MRSA Assay (FDA approved for NASAL specimens only), is one component of a comprehensive MRSA colonization surveillance program. It is not intended to diagnose MRSA infection nor to guide or monitor treatment  for MRSA infections.      Labs: BNP (last 3 results) No results for input(s): BNP in the last 8760 hours. Basic Metabolic Panel:  Recent Labs Lab 09/22/16 2158 09/23/16 0351 09/24/16 0308 09/25/16 0620  NA 139 144 135 138  K 3.4* 4.0 4.4 3.7  CL 104 111 109 111  CO2 16* 20* 9* 14*  GLUCOSE 93 79 81 95  BUN 10 10 8 10   CREATININE 0.84 0.90 1.07 0.90  CALCIUM 8.8* 8.1* 8.2* 9.5  MG  --   --  2.2  --    Liver Function Tests:  Recent Labs Lab 09/22/16 2158 09/23/16 0351  AST 197* 179*  ALT 110* 106*  ALKPHOS 76 74  BILITOT 1.2 0.9  PROT 7.5 7.2  ALBUMIN 4.3 4.0   No results for input(s): LIPASE, AMYLASE in the last 168 hours.  Recent Labs Lab 09/23/16 0351  AMMONIA 15   CBC:  Recent Labs Lab 09/22/16 2158 09/23/16 0351 09/24/16 0308  WBC 7.8 6.4 9.0  NEUTROABS 5.3 4.2  --   HGB 11.4* 11.0* 11.0*  HCT 33.8* 33.5* 34.7*  MCV 91.6 94.1 96.1  PLT 197 174 191   Cardiac Enzymes:  Recent Labs Lab 09/22/16 2158 09/23/16 0351 09/24/16 0308  CKTOTAL 1,724* 1,456* 984*  TROPONINI  --  <  0.03  --    BNP: Invalid input(s): POCBNP CBG:  Recent Labs Lab 09/23/16 1224 09/23/16 1732 09/23/16 2330 09/24/16 0541 09/24/16 1209  GLUCAP 74 83 79 90 106*   D-Dimer No results for input(s): DDIMER in the last 72 hours. Hgb A1c No results for input(s): HGBA1C in the last 72 hours. Lipid Profile No results for input(s): CHOL, HDL, LDLCALC, TRIG, CHOLHDL, LDLDIRECT in the last 72 hours. Thyroid function studies  Recent Labs  09/23/16 0351  TSH 1.798   Anemia work up  Recent Labs  09/23/16 0816  VITAMINB12 338  FOLATE 13.7  FERRITIN 960*  TIBC 319  IRON 164  RETICCTPCT 1.6   Urinalysis    Component Value Date/Time   COLORURINE YELLOW 09/23/2016 [place] 09/23/2016 0614   LABSPEC 1.009 09/23/2016 0614   PHURINE 5.0 09/23/2016 0614   GLUCOSEU NEGATIVE 09/23/2016 0614   HGBUR SMALL (A) 09/23/2016 0614   BILIRUBINUR  NEGATIVE 09/23/2016 0614   KETONESUR 20 (A) 09/23/2016 0614   PROTEINUR NEGATIVE 09/23/2016 0614   NITRITE NEGATIVE 09/23/2016 0614   LEUKOCYTESUR NEGATIVE 09/23/2016 8421   Sepsis Labs Invalid input(s): PROCALCITONIN,  WBC,  LACTICIDVEN Microbiology Recent Results (from the past 240 hour(s))  MRSA PCR Screening     Status: None   Collection Time: 09/23/16  2:56 AM  Result Value Ref Range Status   MRSA by PCR NEGATIVE NEGATIVE Final    Comment:        The GeneXpert MRSA Assay (FDA approved for NASAL specimens only), is one component of a comprehensive MRSA colonization surveillance program. It is not intended to diagnose MRSA infection nor to guide or monitor treatment for MRSA infections.      Time coordinating discharge: 45 minutes  SIGNED:   Tawni Millers, MD  [facility] 09/25/2016, 11:57 AM Pager   If 7PM-7AM, please contact night-coverage [email or web address] Password [email or web address]

## 2016-09-25 NOTE — Progress Notes (Signed)
Johnny Newton ( or [name], as he refers to himself) is admitted today to [facility] by his wife ( " I guess they thought I was getting out of control...but I wasn't) for alcohol detox. He denies mental illness in his family " I have depression" and is quite aloof and not forthcoming during the admissison process today. He says he doesn't think his alcohol problem is " that bad" and is nonplussed and concerned , mostly, with what time his wife can visit him while he is hospitalized. He is unsure of his regular medications and unsure of the dosages " ask my wife". 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 He denies any other drug addicitons, denies feeling suicdal and / or making a suicidal threat. Admission completed and pt oriented to unit.

## 2016-09-25 NOTE — Progress Notes (Addendum)
Patient accepted to [facility]  Room 300 Bed 1, Accepting: [place] Attending: Dr. Parke Poisson Patient under IVC, to be faxed to [phone].  [facility]- request patient transfer after 1:00pm today   CSW received report that patient original IVC paperwork was misplaced while in ICU. CSW called Magistrate and requested copy of paperwork. [employer] representative faxed over a copy of IVC original. CSW confirmed with Otila Kluver he can admit to behavioral health with copy.  Spouse notified about patient discharge.   Kathrin Greathouse, Latanya Presser, MSW Clinical Social Worker [facility] and Psychiatric Service Line [phone] 09/25/2016  10:32 AM

## 2016-09-25 NOTE — Progress Notes (Signed)
Date: September 25, 2016 Chart reviewed for discharge orders: None found for case management. Rhonda Davis,BSN,RN3, CCM/[phone] 

## 2016-09-25 NOTE — Progress Notes (Signed)
Patient attended AA meeting.

## 2016-09-25 NOTE — Progress Notes (Signed)
[employer] called for Transport.

## 2016-09-26 DIAGNOSIS — E860 Dehydration: Secondary | ICD-10-CM

## 2016-09-26 DIAGNOSIS — M6282 Rhabdomyolysis: Secondary | ICD-10-CM

## 2016-09-26 DIAGNOSIS — D649 Anemia, unspecified: Secondary | ICD-10-CM

## 2016-09-26 DIAGNOSIS — F1024 Alcohol dependence with alcohol-induced mood disorder: Secondary | ICD-10-CM

## 2016-09-26 DIAGNOSIS — F331 Major depressive disorder, recurrent, moderate: Secondary | ICD-10-CM

## 2016-09-26 DIAGNOSIS — G92 Toxic encephalopathy: Secondary | ICD-10-CM

## 2016-09-26 DIAGNOSIS — G9340 Encephalopathy, unspecified: Secondary | ICD-10-CM

## 2016-09-26 LAB — GLUCOSE, CAPILLARY
Glucose-Capillary: 107 mg/dL — ABNORMAL HIGH (ref 65–99)
Glucose-Capillary: 118 mg/dL — ABNORMAL HIGH (ref 65–99)
Glucose-Capillary: 83 mg/dL (ref 65–99)
Glucose-Capillary: 97 mg/dL (ref 65–99)

## 2016-09-26 MED ORDER — NALTREXONE HCL 50 MG PO TABS
50.0000 mg | ORAL_TABLET | Freq: Every day | ORAL | Status: DC
  Administered 2016-09-26 – 2016-09-29 (×4): 50 mg via ORAL
  Filled 2016-09-26 (×7): qty 1

## 2016-09-26 NOTE — Plan of Care (Signed)
Problem: Education: Goal: Knowledge of disease or condition will improve Outcome: Progressing Discussed detox protocals with patient. Also discussed the course of detox might be expected to follow. Patient was able to verbalize understanding.

## 2016-09-26 NOTE — BHH Suicide Risk Assessment (Signed)
[facility] Admission Suicide Risk Assessment   Nursing information obtained from:    Demographic factors:    Current Mental Status:    Loss Factors:    Historical Factors:    Risk Reduction Factors:     Total Time spent with patient: 45 minutes Principal Problem: Substance Induced Mood Disorder Diagnosis:   Patient Active Problem List   Diagnosis Date Noted  . MDD (major depressive disorder) [F32.9] 09/25/2016  . Toxic encephalopathy [G92] 09/24/2016  . Acute encephalopathy [G93.40] 09/23/2016  . Suicidal ideation [R45.851] 09/23/2016  . Rhabdomyolysis [M62.82] 09/23/2016  . Normochromic normocytic anemia [D64.9] 09/23/2016  . ETOH abuse [F10.10] 09/23/2016  . Depression [F32.9] 09/23/2016  . Alcohol dependence with alcohol-induced mood disorder ([place]) [F10.24] 09/23/2016  . Dehydration [E86.0]    Subjective Data:  61 year old Caucasian, self employed, married. Background history of SUD (alcohol and THC). Involuntarily committed by his family. Was in restraints at presentation. Reported to have been very agitated at home. Was out in the courtyard naked. He was threatening to shoot himself. His wife left him two days earlier. Patient had been drinking heavily. BAL was 327 mg/dl at presentation. UDS was positive for THC. CK was markedly elevated. Patient was treated at the ER. CK is trending down and now below one thousand. Renal function is normal.   At interview, reports long history of alcohol use. Binge pattern of drinking. Became depressed about a year ago. 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 Now lives on his savings. Has been drinking a bit more. Says he was drunk and acted inappropriately. Says he wants to go off alcohol. No past treatment. Attended AA yesterday and liked it. Says he wants his family back. His wife came to visit him in hospital. Willing to take Naltrexone after we explored the risk and benefits. On sertraline from his PCP. Notes it's helpful. Says depression is  associated with binges of alcohol. He is fine if he does not drink. No past psychiatry hospitalization. No past suicidal behavior. No family history of mental illness or suicide.  Has a couple of pistols at home. Says his brother has taken them away.  No sweatiness, no headaches. No retching, nausea or vomiting. No fullness in the head. No visual, tactile or auditory hallucination. No internal restlessness. No suicidal or homicidal thoughts. No confusion, no diplopia, no falls.  Willing to sign in voluntarily   Continued Clinical Symptoms:  Alcohol Use Disorder Identification Test Final Score (AUDIT): 11 The "Alcohol Use Disorders Identification Test", Guidelines for Use in Primary Care, Second Edition.  World Pharmacologist [facility]). Score between 0-7:  no or low risk or alcohol related problems. Score between 8-15:  moderate risk of alcohol related problems. Score between 16-19:  high risk of alcohol related problems. Score 20 or above:  warrants further diagnostic evaluation for alcohol dependence and treatment.   CLINICAL FACTORS:   Substance use  Depression.    Musculoskeletal: Strength & Muscle Tone: within normal limits Gait & Station: broad based Patient leans: N/A  Psychiatric Specialty Exam: Physical Exam  Constitutional: No distress.  HENT:  Head: Atraumatic.  Eyes: Pupils are equal, round, and reactive to light.  Neck: Normal range of motion. Neck supple.  Cardiovascular: Normal rate.   Skin: He is not diaphoretic.    ROS  Blood pressure 128/80, pulse 89, temperature 98.4 F (36.9 C), temperature source Oral, resp. rate 16, height 5\' 8"  (1.727 m), weight 73.9 kg (163 lb), SpO2 99 %.Body mass index  is 24.78 kg/m.  General Appearance:  Pleasant, no tremors, not sweaty. No ophthalmoplegia. Not internally stimulated.   Eye Contact:  Good  Speech:  Clear and Coherent and Normal Rate  Volume:  Normal  Mood:  Feels much better  Affect:  Appropriate and Full Range   Thought Process:  Linear  Orientation:  Full (Time, Place, and Person)  Thought Content:  Future oriented. No thoughts of violence. No hallucination in any modality.   Suicidal Thoughts:  No  Homicidal Thoughts:  No  Memory:  Immediate;   Fair Recent;   Fair Remote;   Good  Judgement:  Fair  Insight:  Good  Psychomotor Activity:  Normal  Concentration:  Concentration: Fair and Attention Span: Fair  Recall:  [employer] of Knowledge:  Good  Language:  Good  Akathisia:  NA  Handed:    AIMS (if indicated):     Assets:  Communication Skills Desire for Improvement Housing Physical Health Social Support Vocational/Educational  ADL's:  Intact  Cognition:  WNL  Sleep:  Number of Hours: 6.25      COGNITIVE FEATURES THAT CONTRIBUTE TO RISK:  None    SUICIDE RISK:   Moderate:  Frequent suicidal ideation with limited intensity, and duration, some specificity in terms of plans, no associated intent, good self-control, limited dysphoria/symptomatology, some risk factors present, and identifiable protective factors, including available and accessible social support.  PLAN OF CARE:  Patient is not pervasively depressed. He is coming off alcohol smoothly.  No perceptual abnormalities. No evidence of Wernicke's or any other complications. He is rehydrating well. Needs further evaluation.  Psychiatric: SUD (alcohol and THC) MDD  Medical: HTN  Psychosocial:   Marital conflict  PLAN: 1. Alcohol withdrawal protocol 2. Naltrexone 50 mg daily 3. Encourage unit groups and activities 4. Monitor mood, behavior and interaction with peers 5. Motivational enhancement  6. SW would gather collateral and facilitate aftercare 7. Allow voluntary admission.   I certify that inpatient services furnished can reasonably be expected to improve the patient's condition.   [place], MD 09/26/2016, 9:24 AM

## 2016-09-26 NOTE — H&P (Signed)
Psychiatric Admission Assessment Adult  Patient Identification: Johnny Newton  MRN:  [id number]  Date of Evaluation:  09/26/2016  Chief Complaint: Alcohol intoxication with suicidal threats to shoot himself.  Principal Diagnosis: Alcohol dependence with alcohol induced mood disorder,  MDD (major depressive disorder)  Diagnosis:   Patient Active Problem List   Diagnosis Date Noted  . Alcohol dependence with alcohol-induced mood disorder (HCC) [F10.24] 09/23/2016    Priority: High  . MDD (major depressive disorder) [F32.9] 09/25/2016    Priority: Medium  . Toxic encephalopathy [G92] 09/24/2016  . Acute encephalopathy [G93.40] 09/23/2016  . Suicidal ideation [R45.851] 09/23/2016  . Rhabdomyolysis [M62.82] 09/23/2016  . Normochromic normocytic anemia [D64.9] 09/23/2016  . ETOH abuse [F10.10] 09/23/2016  . Depression [F32.9] 09/23/2016  . Dehydration [E86.0]    History of Present Illness: Johnny Newton is a 61 year old Caucasian, self-employed, married. Background history of SUD (alcohol and THC). Involuntarily committed by his family. Was in restraints at presentation. Reported to have been very agitated at home. Was out in the courtyard naked. He was threatening to shoot himself. His wife left him two days earlier. Patient had been drinking heavily. BAL was 327 mg/dl at presentation. UDS was positive for THC. CK was markedly elevated. Patient was treated at the ER. CK is trending down and now below one thousand. Renal function is normal.   At interview, reports long history of alcohol use. Binge pattern of drinking. Became depressed about a year ago, currently on Sertraline prescribed by his primary care provider. 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 Now lives on his savings. Has been drinking a bit more. Says he was drunk and acted inappropriately. Says he wants to go off alcohol. No past treatment. Attended AA yesterday and liked it. Says he wants his family back. His wife came to visit  him in the hospital. Willing to take Naltrexone after we explored the risk and benefits. Says the sertraline from his PCP is helpful. Says depression is associated with binges of alcohol. He is fine if he does not drink. No past psychiatry hospitalization. No past suicidal behavior. No family history of mental illness or suicide.  Has a couple of pistols at home. Says his brother has taken them away.  No sweatiness, no headaches. No retching, nausea or vomiting. No fullness in the head. No visual, tactile or auditory hallucination. No internal restlessness. No suicidal or homicidal thoughts. No confusion, no diplopia, no falls. Willing to sign in voluntarily.  Associated Signs/Symptoms:  Depression Symptoms:  insomnia, disturbed sleep,  (Hypo) Manic Symptoms:  Impulsivity,  Anxiety Symptoms:  Excessive worrying concerning his binge drinking.  Psychotic Symptoms:  Denies any hallucinations, delusional thoughts or paranoia.  PTSD Symptoms: Denies any PTSD symptoms or events.  Total Time spent with patient: 1 hour  Past Psychiatric History:   Is the patient at risk to self? No.  Has the patient been a risk to self in the past 6 months? No.  Has the patient been a risk to self within the distant past? No.  Is the patient a risk to others? No.  Has the patient been a risk to others in the past 6 months? No.  Has the patient been a risk to others within the distant past? No.   Prior Inpatient Therapy:   Prior Outpatient Therapy:    Alcohol Screening: 1. How often do you have a drink containing alcohol?: Monthly or less 2. How many drinks containing alcohol do you have on a typical day  when you are drinking?: 5 or 6 3. How often do you have six or more drinks on one occasion?: Less than monthly Preliminary Score: 3 4. How often during the last year have you found that you were not able to stop drinking once you had started?: Less than monthly 5. How often during the last year have you  failed to do what was normally expected from you becasue of drinking?: Less than monthly 6. How often during the last year have you needed a first drink in the morning to get yourself going after a heavy drinking session?: Less than monthly 7. How often during the last year have you had a feeling of guilt of remorse after drinking?: Less than monthly 8. How often during the last year have you been unable to remember what happened the night before because you had been drinking?: Less than monthly 9. Have you or someone else been injured as a result of your drinking?: No 10. Has a relative or friend or a doctor or another health worker been concerned about your drinking or suggested you cut down?: Yes, but not in the last year Alcohol Use Disorder Identification Test Final Score (AUDIT): 11 Brief Intervention: Patient declined brief intervention  Substance Abuse History in the last 12 months:  Yes.    Consequences of Substance Abuse: Medical Consequences:  Liver damage, Possible death by overdose Legal Consequences:  Arrests, jail time, Loss of driving privilege. Family Consequences:  Family discord, divorce and or separation.  Previous Psychotropic Medications: Yes   Psychological Evaluations: No   Past Medical History:  Past Medical History:  Diagnosis Date  . Depression   . Hypertension    History reviewed. No pertinent surgical history. Family History:  Family History  Problem Relation Age of Onset  . Family history unknown: Yes   Family Psychiatric  History:   Tobacco Screening: Have you used any form of tobacco in the last 30 days? (Cigarettes, Smokeless Tobacco, Cigars, and/or Pipes): No Tobacco use, Select all that apply: 5 or more cigarettes per day Are you interested in Tobacco Cessation Medications?: No, patient refused Counseled patient on smoking cessation including recognizing danger situations, developing coping skills and basic information about quitting provided:  Refused/Declined practical counseling  Social History:  History  Alcohol Use  . 7.2 - 11.4 oz/week  . 5 - 7 Glasses of wine, 7 - 12 Cans of beer per week    Comment: per family 'he's an alcoholic'     History  Drug Use No    Additional Social History: Pain Medications: none Longest period of sobriety (when/how long): unknown Negative Consequences of Use: Personal relationships, Work / Youth worker, Scientist, research (physical sciences), Museum/gallery curator Withdrawal Symptoms: Agitation, Blackouts  Allergies:  No Known Allergies  Lab Results:  Results for orders placed or performed during the hospital encounter of 09/22/16 (from the past 48 hour(s))  Glucose, capillary     Status: None   Collection Time: 09/24/16  5:53 PM  Result Value Ref Range   Glucose-Capillary 83 65 - 99 mg/dL  Glucose, capillary     Status: None   Collection Time: 09/25/16 12:48 AM  Result Value Ref Range   Glucose-Capillary 97 65 - 99 mg/dL  Basic metabolic panel     Status: Abnormal   Collection Time: 09/25/16  6:20 AM  Result Value Ref Range   Sodium 138 135 - 145 mmol/L   Potassium 3.7 3.5 - 5.1 mmol/L   Chloride 111 101 - 111 mmol/L   CO2  14 (L) 22 - 32 mmol/L   Glucose, Bld 95 65 - 99 mg/dL   BUN 10 6 - 20 mg/dL   Creatinine, Ser 0.90 0.61 - 1.24 mg/dL   Calcium 9.5 8.9 - 10.3 mg/dL   GFR calc non Af Amer >60 >60 mL/min   GFR calc Af Amer >60 >60 mL/min    Comment: (NOTE) The eGFR has been calculated using the CKD EPI equation. This calculation has not been validated in all clinical situations. eGFR's persistently <60 mL/min signify possible Chronic Kidney Disease.    Anion gap 13 5 - 15  Glucose, capillary     Status: Abnormal   Collection Time: 09/25/16  6:23 AM  Result Value Ref Range   Glucose-Capillary 107 (H) 65 - 99 mg/dL  Glucose, capillary     Status: Abnormal   Collection Time: 09/25/16 12:13 PM  Result Value Ref Range   Glucose-Capillary 118 (H) 65 - 99 mg/dL    Blood Alcohol level:  Lab Results  Component Value  Date   ETH 327 (HH) 16/01/9603   Metabolic Disorder Labs:  No results found for: HGBA1C, MPG No results found for: PROLACTIN No results found for: CHOL, TRIG, HDL, CHOLHDL, VLDL, LDLCALC  Current Medications: Current Facility-Administered Medications  Medication Dose Route Frequency Provider Last Rate Last Dose  . acetaminophen (TYLENOL) tablet 650 mg  650 mg Oral Q6H PRN Okonkwo, Justina A, NP      . alum & mag hydroxide-simeth (MAALOX/MYLANTA) 200-200-20 MG/5ML suspension 30 mL  30 mL Oral Q4H PRN Okonkwo, Justina A, NP      . calcium carbonate (TUMS - dosed in mg elemental calcium) chewable tablet 200 mg of elemental calcium  1 tablet Oral TID PRN Lu Duffel, Justina A, NP      . hydrOXYzine (ATARAX/VISTARIL) tablet 25 mg  25 mg Oral TID PRN Okonkwo, Justina A, NP      . loperamide (IMODIUM) capsule 2-4 mg  2-4 mg Oral PRN Okonkwo, Justina A, NP      . LORazepam (ATIVAN) tablet 1 mg  1 mg Oral Q6H PRN Okonkwo, Justina A, NP      . LORazepam (ATIVAN) tablet 1 mg  1 mg Oral QID Okonkwo, Justina A, NP   1 mg at 09/26/16 1209   Followed by  . [START ON 09/27/2016] LORazepam (ATIVAN) tablet 1 mg  1 mg Oral TID Hughie Closs A, NP       Followed by  . [START ON 09/28/2016] LORazepam (ATIVAN) tablet 1 mg  1 mg Oral BID Okonkwo, Justina A, NP       Followed by  . [START ON 09/29/2016] LORazepam (ATIVAN) tablet 1 mg  1 mg Oral Daily Okonkwo, Justina A, NP      . magnesium hydroxide (MILK OF MAGNESIA) suspension 30 mL  30 mL Oral Daily PRN Okonkwo, Justina A, NP      . metoprolol succinate (TOPROL-XL) 24 hr tablet 50 mg  50 mg Oral Daily Okonkwo, Justina A, NP   50 mg at 09/26/16 0826  . multivitamin with minerals tablet 1 tablet  1 tablet Oral Daily Okonkwo, Justina A, NP   1 tablet at 09/26/16 0826  . naltrexone (DEPADE) tablet 50 mg  50 mg Oral Daily Izediuno, Laruth Bouchard, MD   50 mg at 09/26/16 1209  . ondansetron (ZOFRAN-ODT) disintegrating tablet 4 mg  4 mg Oral Q6H PRN Okonkwo, Justina A, NP       . sertraline (ZOLOFT) tablet 150 mg  150  mg Oral Daily Okonkwo, Justina A, NP   150 mg at 09/26/16 0826  . thiamine (VITAMIN B-1) tablet 100 mg  100 mg Oral Daily Okonkwo, Justina A, NP   100 mg at 09/26/16 0825  . traZODone (DESYREL) tablet 50 mg  50 mg Oral QHS Okonkwo, Justina A, NP   50 mg at 09/25/16 2140   PTA Medications: Prescriptions Prior to Admission  Medication Sig Dispense Refill Last Dose  . metoprolol succinate (TOPROL-XL) 50 MG 24 hr tablet Take 1 tablet by mouth daily.  1 Unknown at Unknown time  . sertraline (ZOLOFT) 100 MG tablet Take 150 mg by mouth daily.  1 Unknown at Unknown time  . traZODone (DESYREL) 50 MG tablet Take 1 tablet (50 mg total) by mouth at bedtime. 30 tablet 0 Unknown at Unknown time   Musculoskeletal: Strength & Muscle Tone: within normal limits Gait & Station: normal Patient leans: N/A  Psychiatric Specialty Exam: Physical Exam  Constitutional: He is oriented to person, place, and time. He appears well-developed.  HENT:  Head: Normocephalic.  Eyes: Pupils are equal, round, and reactive to light.  Neck: Normal range of motion.  Cardiovascular: Normal rate.   Respiratory: Effort normal.  GI: Soft.  Genitourinary:  Genitourinary Comments: Deferred  Musculoskeletal: Normal range of motion.  Neurological: He is alert and oriented to person, place, and time.  Skin: Skin is warm and dry.    Review of Systems  Constitutional: Positive for malaise/fatigue.  HENT: Negative.   Eyes: Negative.   Respiratory: Negative.   Cardiovascular: Negative.   Gastrointestinal: Negative.   Genitourinary: Negative.   Musculoskeletal: Negative.   Skin: Negative.   Neurological: Negative.   Endo/Heme/Allergies: Negative.   Psychiatric/Behavioral: Positive for depression and substance abuse. Negative for hallucinations, memory loss and suicidal ideas. The patient is nervous/anxious and has insomnia.     Blood pressure 122/81, pulse 91, temperature 98.4 F  (36.9 C), temperature source Oral, resp. rate 16, height $RemoveBe'5\' 8"'[id number]$  (1.727 m), weight 73.9 kg (163 lb), SpO2 99 %.Body mass index is 24.78 kg/m.  General Appearance:  Pleasant, no tremors, not sweaty. No ophthalmoplegia. Not internally stimulated.   Eye Contact:  Good  Speech:  Clear and Coherent and Normal Rate  Volume:  Normal  Mood:  Feels much better  Affect:  Appropriate and Full Range  Thought Process:  Linear  Orientation:  Full (Time, Place, and Person)  Thought Content:  Future oriented. No thoughts of violence. No hallucination in any modality.   Suicidal Thoughts:  No  Homicidal Thoughts:  No  Memory:  Immediate;   Fair Recent;   Fair Remote;   Good  Judgement:  Fair  Insight:  Good  Psychomotor Activity:  Normal  Concentration:  Concentration: Fair and Attention Span: Fair  Recall:  [employer] of Knowledge:  Good  Language:  Good  Akathisia:  NA  Handed:    AIMS (if indicated):     Assets:  Communication Skills Desire for Improvement Housing Physical Health Social Support Vocational/Educational  ADL's:  Intact  Cognition:  WNL  Sleep:  Number of Hours: 6.25     Treatment Plan/Recommendations: 1. Admit for crisis management and stabilization, estimated length of stay 3-5 days.  2. Medication management to reduce current symptoms to base line and improve the patient's overall level of functioning: See MAR, Md's SRA & treatment plan.  3. Treat health problems as indicated.  4. Develop treatment plan to decrease risk of relapse upon discharge and the  need for readmission.  5. Psycho-social education regarding relapse prevention and self care.  6. Health care follow up as needed for medical problems.  7. Review, reconcile, and reinstate any pertinent home medications for other health issues where appropriate. 8. Call for consults with hospitalist for any additional specialty patient care services as needed.  Observation Level/Precautions:  15 minute checks   Laboratory:  Per ED, BAL 327, UDS (+) for THC  Psychotherapy: Group milieu    Medications: See MAR  Consultations: As needed.   Discharge Concerns: Safety, maintaining sobriety.   Estimated LOS: 2-4 days  Other: Admit to the [facility].    Physician Treatment Plan for Primary Diagnosis: MDD (major depressive disorder)  Long Term Goal(s): Improvement in symptoms so as ready for discharge  Short Term Goals: Ability to identify changes in lifestyle to reduce recurrence of condition will improve, Ability to verbalize feelings will improve, Ability to disclose and discuss suicidal ideas and Ability to demonstrate self-control will improve  Physician Treatment Plan for Secondary Diagnosis: Principal Problem:   MDD (major depressive disorder)  Long Term Goal(s): Improvement in symptoms so as ready for discharge  Short Term Goals: Ability to identify and develop effective coping behaviors will improve, Ability to maintain clinical measurements within normal limits will improve, Compliance with prescribed medications will improve and Ability to identify triggers associated with substance abuse/mental health issues will improve  I certify that inpatient services furnished can reasonably be expected to improve the patient's condition.    Encarnacion Slates, NP, PMHNP, FNP-BC 6/9/20181:26 PM

## 2016-09-26 NOTE — Progress Notes (Signed)
Patient did not attend group.

## 2016-09-26 NOTE — Progress Notes (Signed)
Pt is new to the unit this evening.  He reports that he is having no withdrawal symptoms at this time, but does present anxious and isolative.  He denies SI/HI/AVH.  Writer reviewed unit evening routine with pt, and he attended evening group.  Pt voiced no needs or concerns.  Support and encouragement offered.  Discharge plans are in process.  Safety maintained with q15 minute checks.

## 2016-09-26 NOTE — [facility] Group Notes (Signed)
[facility] Group Notes:  (Nursing/MHT/Case Management/Adjunct)  Date:  09/26/2016  Time:  1315  Type of Therapy:  Nurse Education  Participation Level:  Active  Participation Quality:  Attentive  Affect:  Appropriate  Cognitive:  Alert  Insight:  Improving  Engagement in Group:  Engaged  Modes of Intervention:  Discussion and Education  Summary of Progress/Problems: Nurse led exercise on improving, strengthening self esteem.  Jamie Kato 09/26/2016, 2:22 PM

## 2016-09-26 NOTE — Progress Notes (Signed)
Data. Patient denies SI/HI/AVH. Patient has been minimizing his substance abuse ans has denied all detox symptoms. Patient interacting well with staff and other patients, though he has spent quite a bit of time in his room reading. Patient also frequently does not initiate interactions, with staff or patients.Affect has been blunt, and does not brighten with interaction. Patient's goal today is: "Be myself as my parents raised me." Action. Emotional support and encouragement offered. Education provided on medication, indications and side effect. Q 15 minute checks done for safety. Response. Safety on the unit maintained through 15 minute checks.  Medications taken as prescribed. Attended groups. Remained calm and appropriate through out shift.

## 2016-09-26 NOTE — [facility] Group Notes (Addendum)
[facility] Group Notes:  (Clinical Social Work)   02/16/2015     10:00-11:00AM  Summary of Progress/Problems:   In today's process group a decisional balance exercise was used to explore in depth the perceived benefits and costs of alcohol and drugs, as well as the  benefits and costs of replacing these with healthy coping skills.  Patients listed healthy and unhealthy coping techniques, determining that unhealthy coping techniques work initially, but eventually become harmful.  Motivational Interviewing and the whiteboard were utilized for the exercises.  The patient expressed that the unhealthy coping he often uses is drinking.  He was pulled out of the group to meet with psychiatric provider, and when he returned, listened attentively but did not participate in the discussion.  He prefers the name "Johnny Newton."  Type of Therapy:  Group Therapy - Process   Participation Level:  Minimal  Participation Quality:  Attentive  Affect:  Blunted and Depressed  Cognitive:  Appropriate  Insight:  Improving  Engagement in Therapy:  Improving  Modes of Intervention:  Education, Motivational Interviewing  Johnny Dominion, LCSW 09/26/2016, 4:57 PM

## 2016-09-26 NOTE — Progress Notes (Signed)
D.  Pt pleasant on approach, in room reading.  Pt denies complaints at this time, withdrawal symptoms moderate.  Pt did not attend evening group, has remained in room this shift.  Pt denies SI/HI/hallucinations at this time.  A.  Support and encouragement offered, medication given as ordered  R.  Pt remains safe on the unit, will continue to monitor.

## 2016-09-27 DIAGNOSIS — G4700 Insomnia, unspecified: Secondary | ICD-10-CM

## 2016-09-27 DIAGNOSIS — F329 Major depressive disorder, single episode, unspecified: Principal | ICD-10-CM

## 2016-09-27 LAB — CK: Total CK: 172 U/L (ref 49–397)

## 2016-09-27 NOTE — Progress Notes (Signed)
Data. Patient denies SI/HI/AVH.  Patient interacting well with staff and other patients. Does not initiate interaction much with peers, but does more so with staff. Patient has asked on a number of occassions, this shift, "When is the doctor going to let me go home?" Patient appears to not retain education well, often asking questions that have been answered. Patient's affect is anxious, but does brighten with interaction. He denies any detox symptoms. No tremors noted to extremities.  Action. Emotional support and encouragement offered. Education provided on medication, indications and side effect. Q 15 minute checks done for safety. Response. Safety on the unit maintained through 15 minute checks.  Medications taken as prescribed. Attended groups. Remained calm and appropriate through out shift.

## 2016-09-27 NOTE — Progress Notes (Signed)
Patient did attend the evening speaker AA meeting.  
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
## 2016-09-27 NOTE — Progress Notes (Signed)
[facility] MD Progress Note  09/27/2016 9:51 AM Johnny Newton  MRN:  [id number]   Subjective:  Patient reports " I am feeling great." Patient is requesting to discharge today. Reports he want to go home to his wife.  Objective: Johnny Newton is awake, alert and oriented. Seen attending group session.  Denies suicidal or homicidal ideation. Denies auditory or visual hallucination and does not appear to be responding to internal stimuli. Patient reports tolerating  medication well.  Patient reports" I was sent here due to my anger issues. However I feel better now." Denies depression or depressive symptoms.  Reports good appetite and states he is resting well. Support, encouragement and reassurance was provided.   Principal Problem: MDD (major depressive disorder) Diagnosis:   Patient Active Problem List   Diagnosis Date Noted  . MDD (major depressive disorder) [F32.9] 09/25/2016  . Toxic encephalopathy [G92] 09/24/2016  . Acute encephalopathy [G93.40] 09/23/2016  . Suicidal ideation [R45.851] 09/23/2016  . Rhabdomyolysis [M62.82] 09/23/2016  . Normochromic normocytic anemia [D64.9] 09/23/2016  . ETOH abuse [F10.10] 09/23/2016  . Depression [F32.9] 09/23/2016  . Alcohol dependence with alcohol-induced mood disorder ([place]) [F10.24] 09/23/2016  . Dehydration [E86.0]    Total Time spent with patient: 30 minutes  Past Psychiatric History:  Past Medical History:  Past Medical History:  Diagnosis Date  . Depression   . Hypertension    History reviewed. No pertinent surgical history. Family History:  Family History  Problem Relation Age of Onset  . Family history unknown: Yes   Family Psychiatric  History:  Social History:  History  Alcohol Use  . 7.2 - 11.4 oz/week  . 5 - 7 Glasses of wine, 7 - 12 Cans of beer per week    Comment: per family 'he's an alcoholic'     History  Drug Use No    Social History   Social History  . Marital status: Unknown    Spouse name: N/A  .  Number of children: N/A  . Years of education: N/A   Social History Main Topics  . Smoking status: Never Smoker  . Smokeless tobacco: Never Used  . Alcohol use 7.2 - 11.4 oz/week    5 - 7 Glasses of wine, 7 - 12 Cans of beer per week     Comment: per family 'he's an alcoholic'  . Drug use: No  . Sexual activity: Not Currently   Other Topics Concern  . None   Social History Narrative  . None   Additional Social History:    Pain Medications: none Longest period of sobriety (when/how long): unknown Negative Consequences of Use: Personal relationships, Work / Youth worker, Scientist, research (physical sciences), Museum/gallery curator Withdrawal Symptoms: Agitation, Blackouts                    Sleep: Fair  Appetite:  Fair  Current Medications: Current Facility-Administered Medications  Medication Dose Route Frequency Provider Last Rate Last Dose  . acetaminophen (TYLENOL) tablet 650 mg  650 mg Oral Q6H PRN Okonkwo, Justina A, NP      . alum & mag hydroxide-simeth (MAALOX/MYLANTA) 200-200-20 MG/5ML suspension 30 mL  30 mL Oral Q4H PRN Okonkwo, Justina A, NP      . calcium carbonate (TUMS - dosed in mg elemental calcium) chewable tablet 200 mg of elemental calcium  1 tablet Oral TID PRN Okonkwo, Justina A, NP      . hydrOXYzine (ATARAX/VISTARIL) tablet 25 mg  25 mg Oral TID PRN Lu Duffel, Justina A, NP      .  loperamide (IMODIUM) capsule 2-4 mg  2-4 mg Oral PRN Okonkwo, Justina A, NP      . LORazepam (ATIVAN) tablet 1 mg  1 mg Oral Q6H PRN Okonkwo, Justina A, NP      . LORazepam (ATIVAN) tablet 1 mg  1 mg Oral TID Lu Duffel, Justina A, NP   1 mg at 09/27/16 0740   Followed by  . [START ON 09/28/2016] LORazepam (ATIVAN) tablet 1 mg  1 mg Oral BID Okonkwo, Justina A, NP       Followed by  . [START ON 09/29/2016] LORazepam (ATIVAN) tablet 1 mg  1 mg Oral Daily Okonkwo, Justina A, NP      . magnesium hydroxide (MILK OF MAGNESIA) suspension 30 mL  30 mL Oral Daily PRN Okonkwo, Justina A, NP      . metoprolol succinate (TOPROL-XL)  24 hr tablet 50 mg  50 mg Oral Daily Okonkwo, Justina A, NP   50 mg at 09/27/16 0740  . multivitamin with minerals tablet 1 tablet  1 tablet Oral Daily Okonkwo, Justina A, NP   1 tablet at 09/27/16 0740  . naltrexone (DEPADE) tablet 50 mg  50 mg Oral Daily Izediuno, Laruth Bouchard, MD   50 mg at 09/27/16 0740  . ondansetron (ZOFRAN-ODT) disintegrating tablet 4 mg  4 mg Oral Q6H PRN Okonkwo, Justina A, NP      . sertraline (ZOLOFT) tablet 150 mg  150 mg Oral Daily Okonkwo, Justina A, NP   150 mg at 09/27/16 0740  . thiamine (VITAMIN B-1) tablet 100 mg  100 mg Oral Daily Okonkwo, Justina A, NP   100 mg at 09/27/16 0740  . traZODone (DESYREL) tablet 50 mg  50 mg Oral QHS Okonkwo, Justina A, NP   50 mg at 09/26/16 2057    Lab Results:  Results for orders placed or performed during the hospital encounter of 09/22/16 (from the past 48 hour(s))  Glucose, capillary     Status: Abnormal   Collection Time: 09/25/16 12:13 PM  Result Value Ref Range   Glucose-Capillary 118 (H) 65 - 99 mg/dL    Blood Alcohol level:  Lab Results  Component Value Date   ETH 327 (HH) 76/19/5093    Metabolic Disorder Labs: No results found for: HGBA1C, MPG No results found for: PROLACTIN No results found for: CHOL, TRIG, HDL, CHOLHDL, VLDL, LDLCALC  Physical Findings: AIMS: Facial and Oral Movements Muscles of Facial Expression: None, normal Lips and Perioral Area: None, normal Jaw: None, normal Tongue: None, normal,Extremity Movements Upper (arms, wrists, hands, fingers): None, normal Lower (legs, knees, ankles, toes): None, normal, Trunk Movements Neck, shoulders, hips: None, normal, Overall Severity Severity of abnormal movements (highest score from questions above): None, normal Incapacitation due to abnormal movements: None, normal Patient's awareness of abnormal movements (rate only patient's report): No Awareness, Dental Status Current problems with teeth and/or dentures?: No Does patient usually wear  dentures?: No  CIWA:  CIWA-Ar Total: 4 COWS:  COWS Total Score: 2  Musculoskeletal: Strength & Muscle Tone: within normal limits Gait & Station: broad based Patient leans: N/A  Psychiatric Specialty Exam: Physical Exam  Vitals reviewed. Constitutional: He is oriented to person, place, and time. He appears well-developed.  Cardiovascular: Normal rate.   Neurological: He is alert and oriented to person, place, and time.  Psychiatric: He has a normal mood and affect. His behavior is normal.    Review of Systems  Psychiatric/Behavioral: Positive for substance abuse and suicidal ideas. Negative for depression and hallucinations.  The patient is not nervous/anxious.     Blood pressure 135/74, pulse 91, temperature 98.4 F (36.9 C), temperature source Oral, resp. rate 16, height 5\' 8"  (1.727 m), weight 73.9 kg (163 lb), SpO2 99 %.Body mass index is 24.78 kg/m.  General Appearance: Casual  Eye Contact:  Fair  Speech:  Slow  Volume:  Normal  Mood:  Anxious  Affect:  Congruent  Thought Process:  Coherent and Descriptions of Associations: Intact  Orientation:  Full (Time, Place, and Person)  Thought Content:  Hallucinations: None  Suicidal Thoughts:  No  Homicidal Thoughts:  No  Memory:  Immediate;   Fair Recent;   Fair Remote;   Fair  Judgement:  Fair  Insight:  Fair  Psychomotor Activity:  [place]  Concentration:  Concentration: Fair  Recall:  [employer] of Knowledge:  Fair  Language:  Fair  Akathisia:  No  Handed:  Right  AIMS (if indicated):     Assets:  Communication Skills Desire for Improvement Resilience Social Support  ADL's:  Intact  Cognition:  WNL  Sleep:  Number of Hours: 5     I agree with current treatment plan on 09/27/2016, Patient seen face-to-face for psychiatric evaluation follow-up, chart reviewed. Reviewed the information documented and agree with the treatment plan.  Treatment Plan Summary: Daily contact with patient to assess and evaluate  symptoms and progress in treatment and Medication management   Continue Zoloft 150 mg mood stabilization. Continue with Trazodone 50 mg for insomnia Started on CWIA/ Ativan Protocol and Depade 50 mg Will continue to monitor vitals ,medication compliance and treatment side effects while patient is here.  Reviewed labs RBC 3.61 Hemoglobin 11.0 ,BAL - 198, UDS -  Repeat total CK  CSW will start working on disposition.  Patient to participate in therapeutic milieu   Derrill Center, NP 09/27/2016, 9:51 AM

## 2016-09-27 NOTE — [facility] Group Notes (Signed)
[facility] Group Notes:  (Nursing/MHT/Case Management/Adjunct)  Date:  09/27/2016  Time:  4:33 PM Type of Therapy:  Psychoeducational Skills  Participation Level:  Active  Participation Quality:  Appropriate  Affect:  Appropriate  Cognitive:  Appropriate  Insight:  Appropriate  Engagement in Group:  Engaged  Modes of Intervention:  Problem-solving  Summary of Progress/Problems: Group encouraged to surround themselves with positive and healthy group/support system when changing to a healthy life style.    [name] 09/27/2016, 4:33 PM

## 2016-09-27 NOTE — Plan of Care (Signed)
Problem: Education: Goal: Understanding of discharge needs will improve Outcome: Progressing Patient has been asking and received education about the support needed for successful sobriety when he leaves.

## 2016-09-27 NOTE — [facility] Group Notes (Signed)
     [facility] LCSW Group Therapy  09/27/2016 11 AM  Type of Therapy:  Group Therapy  Participation Level:  Minimal  Participation Quality:  Attentive  Affect:  Anxious  Cognitive:  Alert  Insight:  Limited  Engagement in Therapy:  Limited  Modes of Intervention:  Discussion, Exploration, Problem-solving, Socialization and Support  Summary of Progress/Problems: Topic for today was thoughts and feelings regarding discharge. We discussed fears of upcoming changes including judgements, expectations and stigma of mental health issues. We then discussed supports: what constitutes a supportive framework, identification of supports and what to do when others are not supportive. Patients then processed the concept of self care and what that might look like. Patient did share awareness of need for abstinence from alcohol.  Sheilah Pigeon, LCSW

## 2016-09-27 NOTE — Progress Notes (Signed)
D.  Pt pleasant on approach, continues to endorse alcohol cravings, no other complaints voiced.  Pt was positive for evening AA group, observed interacting minimally but appropriately with peers on the unit.  Pt denies SI/HI/hallucinations at this time.  A,.  Support and encouragement offered, medication given as ordered.  R.  Pt remains safe on the unit, will continue to monitor.

## 2016-09-28 NOTE — Progress Notes (Signed)
Recreation Therapy Notes  Date: 09/28/16 Time: 0930 Location: [address] Group Room  Group Topic: Stress Management  Goal Area(s) Addresses:  Patient will verbalize importance of using healthy stress management.  Patient will identify positive emotions associated with healthy stress management.   Behavioral Response: Engaged  Intervention: Stress Management  Activity :  Meditation.  LRT introduced the stress management technique of meditation.  LRT played a meditation from the Calm app to allow patients to participate in meditating.  Patients were to follow along with the meditation to fully engage in the technique.  Education:  Stress Management, Discharge Planning.   Education Outcome: Acknowledges edcuation/In group clarification offered/Needs additional education  Clinical Observations/Feedback: Pt attended group.   Victorino Sparrow, LRT/CTRS        Victorino Sparrow A 09/28/2016 12:22 PM

## 2016-09-28 NOTE — Plan of Care (Signed)
Problem: Activity: Goal: Interest or engagement in activities will improve Outcome: Progressing Pt did attend evening AA group tonight

## 2016-09-28 NOTE — Tx Team (Signed)
Interdisciplinary Treatment and Diagnostic Plan Update  09/28/2016 Time of Session: 0930 Johnny Newton MRN: [id number]  Principal Diagnosis: MDD (major depressive disorder)  Secondary Diagnoses: Principal Problem:   MDD (major depressive disorder)   Current Medications:  Current Facility-Administered Medications  Medication Dose Route Frequency Provider Last Rate Last Dose  . acetaminophen (TYLENOL) tablet 650 mg  650 mg Oral Q6H PRN Okonkwo, Justina A, NP      . alum & mag hydroxide-simeth (MAALOX/MYLANTA) 200-200-20 MG/5ML suspension 30 mL  30 mL Oral Q4H PRN Okonkwo, Justina A, NP      . calcium carbonate (TUMS - dosed in mg elemental calcium) chewable tablet 200 mg of elemental calcium  1 tablet Oral TID PRN Lu Duffel, Justina A, NP      . hydrOXYzine (ATARAX/VISTARIL) tablet 25 mg  25 mg Oral TID PRN Okonkwo, Justina A, NP      . loperamide (IMODIUM) capsule 2-4 mg  2-4 mg Oral PRN Okonkwo, Justina A, NP      . LORazepam (ATIVAN) tablet 1 mg  1 mg Oral Q6H PRN Okonkwo, Justina A, NP   1 mg at 09/27/16 2111  . LORazepam (ATIVAN) tablet 1 mg  1 mg Oral BID Okonkwo, Justina A, NP   1 mg at 09/28/16 0813   Followed by  . [START ON 09/29/2016] LORazepam (ATIVAN) tablet 1 mg  1 mg Oral Daily Okonkwo, Justina A, NP      . magnesium hydroxide (MILK OF MAGNESIA) suspension 30 mL  30 mL Oral Daily PRN Okonkwo, Justina A, NP      . metoprolol succinate (TOPROL-XL) 24 hr tablet 50 mg  50 mg Oral Daily Okonkwo, Justina A, NP   50 mg at 09/28/16 0813  . multivitamin with minerals tablet 1 tablet  1 tablet Oral Daily Okonkwo, Justina A, NP   1 tablet at 09/28/16 0813  . naltrexone (DEPADE) tablet 50 mg  50 mg Oral Daily Izediuno, Laruth Bouchard, MD   50 mg at 09/28/16 0813  . ondansetron (ZOFRAN-ODT) disintegrating tablet 4 mg  4 mg Oral Q6H PRN Okonkwo, Justina A, NP      . sertraline (ZOLOFT) tablet 150 mg  150 mg Oral Daily Okonkwo, Justina A, NP   150 mg at 09/28/16 0813  . thiamine (VITAMIN B-1)  tablet 100 mg  100 mg Oral Daily Okonkwo, Justina A, NP   100 mg at 09/28/16 0813  . traZODone (DESYREL) tablet 50 mg  50 mg Oral QHS Okonkwo, Justina A, NP   50 mg at 09/27/16 2111   PTA Medications: Prescriptions Prior to Admission  Medication Sig Dispense Refill Last Dose  . metoprolol succinate (TOPROL-XL) 50 MG 24 hr tablet Take 1 tablet by mouth daily.  1 Unknown at Unknown time  . sertraline (ZOLOFT) 100 MG tablet Take 150 mg by mouth daily.  1 Unknown at Unknown time  . traZODone (DESYREL) 50 MG tablet Take 1 tablet (50 mg total) by mouth at bedtime. 30 tablet 0 Unknown at Unknown time    Patient Stressors: Educational concerns Financial difficulties Health problems Legal issue  Patient Strengths: Ability for insight Active sense of humor Average or above average intelligence  Treatment Modalities: Medication Management, Group therapy, Case management,  1 to 1 session with clinician, Psychoeducation, Recreational therapy.   Physician Treatment Plan for Primary Diagnosis: MDD (major depressive disorder) Long Term Goal(s): Improvement in symptoms so as ready for discharge Improvement in symptoms so as ready for discharge   Short Term Goals: Ability  to identify changes in lifestyle to reduce recurrence of condition will improve Ability to verbalize feelings will improve Ability to disclose and discuss suicidal ideas Ability to demonstrate self-control will improve Ability to identify and develop effective coping behaviors will improve Ability to maintain clinical measurements within normal limits will improve Compliance with prescribed medications will improve Ability to identify triggers associated with substance abuse/mental health issues will improve  Medication Management: Evaluate patient's response, side effects, and tolerance of medication regimen.  Therapeutic Interventions: 1 to 1 sessions, Unit Group sessions and Medication administration.  Evaluation of  Outcomes: Not Met  Physician Treatment Plan for Secondary Diagnosis: Principal Problem:   MDD (major depressive disorder)  Long Term Goal(s): Improvement in symptoms so as ready for discharge Improvement in symptoms so as ready for discharge   Short Term Goals: Ability to identify changes in lifestyle to reduce recurrence of condition will improve Ability to verbalize feelings will improve Ability to disclose and discuss suicidal ideas Ability to demonstrate self-control will improve Ability to identify and develop effective coping behaviors will improve Ability to maintain clinical measurements within normal limits will improve Compliance with prescribed medications will improve Ability to identify triggers associated with substance abuse/mental health issues will improve     Medication Management: Evaluate patient's response, side effects, and tolerance of medication regimen.  Therapeutic Interventions: 1 to 1 sessions, Unit Group sessions and Medication administration.  Evaluation of Outcomes: Not Met   RN Treatment Plan for Primary Diagnosis: MDD (major depressive disorder) Long Term Goal(s): Knowledge of disease and therapeutic regimen to maintain health will improve  Short Term Goals: Ability to remain free from injury will improve, Ability to disclose and discuss suicidal ideas and Ability to identify and develop effective coping behaviors will improve  Medication Management: RN will administer medications as ordered by provider, will assess and evaluate patient's response and provide education to patient for prescribed medication. RN will report any adverse and/or side effects to prescribing provider.  Therapeutic Interventions: 1 on 1 counseling sessions, Psychoeducation, Medication administration, Evaluate responses to treatment, Monitor vital signs and CBGs as ordered, Perform/monitor CIWA, COWS, AIMS and Fall Risk screenings as ordered, Perform wound care treatments as  ordered.  Evaluation of Outcomes: Progressing   LCSW Treatment Plan for Primary Diagnosis: MDD (major depressive disorder) Long Term Goal(s): Safe transition to appropriate next level of care at discharge, Engage patient in therapeutic group addressing interpersonal concerns.  Short Term Goals: Engage patient in aftercare planning with referrals and resources, Facilitate patient progression through stages of change regarding substance use diagnoses and concerns and Identify triggers associated with mental health/substance abuse issues  Therapeutic Interventions: Assess for all discharge needs, 1 to 1 time with Social worker, Explore available resources and support systems, Assess for adequacy in community support network, Educate family and significant other(s) on suicide prevention, Complete Psychosocial Assessment, Interpersonal group therapy.  Evaluation of Outcomes: Not Met   Progress in Treatment: Attending groups: Yes. Participating in groups: Yes. Taking medication as prescribed: Yes. Toleration medication: Yes. Family/Significant other contact made: No, will contact:  family member if patient consents Patient understands diagnosis: Yes. Discussing patient identified problems/goals with staff: Yes. Medical problems stabilized or resolved: Yes. Denies suicidal/homicidal ideation: Yes. Issues/concerns per patient self-inventory: No. Other: n/a  New problem(s) identified: No, Describe:  n/a  New Short Term/Long Term Goal(s): detox, medication stabilization, development of comprehensive mental wellness/sobriety plan.   Discharge Plan or Barriers: CSW assessing for appropriate referrals. Pt sees PCP currently; unsure if  he is wanting inpatient/outpatient mental health care. CSW to re-assess today.   Reason for Continuation of Hospitalization: Aggression Depression Medication stabilization Withdrawal symptoms  Estimated Length of Stay: 3-5 days   Attendees: Patient:  09/28/2016 8:39 AM  Physician: Dr. Sanjuana Letters MD 09/28/2016 8:39 AM  Nursing: Aileen Fass RN 09/28/2016 8:39 AM  RN Care Manager: Lars Pinks CM 09/28/2016 8:39 AM  Social Worker: Maxie Better, LCSW 09/28/2016 8:39 AM  Recreational Therapist: x 09/28/2016 8:39 AM  Other: Lindell Spar NP 09/28/2016 8:39 AM  Other:  09/28/2016 8:39 AM  Other: 09/28/2016 8:39 AM    Scribe for Treatment Team: Starr, LCSW 09/28/2016 8:39 AM

## 2016-09-28 NOTE — Plan of Care (Signed)
Problem: Safety: Goal: Periods of time without injury will increase Outcome: Progressing Patient is on q15 minute safety checks and contracts for safety on the unit.   

## 2016-09-28 NOTE — Progress Notes (Signed)
Pt attend wrap up group. His day was a 9. His goal to be kind and have self confidence for himself.

## 2016-09-28 NOTE — [facility] Group Notes (Signed)
[facility] LCSW Group Therapy  09/28/2016 3:28 PM  Type of Therapy:  Group Therapy  Participation Level:  Active  Participation Quality:  Attentive  Affect:  Appropriate  Cognitive:  Alert and Oriented  Insight:  Improving  Engagement in Therapy:  Improving  Modes of Intervention:  Confrontation, Discussion, Education, Socialization and Support  Summary of Progress/Problems: Today's Topic: Overcoming Obstacles. Patients identified one short term goal and potential obstacles in reaching this goal. Patients processed barriers involved in overcoming these obstacles. Patients identified steps necessary for overcoming these obstacles and explored motivation (internal and external) for facing these difficulties head on. Margarito was attentive during group and stated that he wanted to discharge tomorrow. He is open to learning more about the [facility] for outpatient treatment, but wants an appt made with his primary care doctor.   Kais Monje N Smart LCSW 09/28/2016, 3:28 PM

## 2016-09-28 NOTE — Progress Notes (Signed)
[facility] MD Progress Note  09/28/2016 2:31 PM Johnny Newton  MRN:  [id number] Subjective:   61 year old Caucasian, self employed, married. Background history of SUD (alcohol and THC). Involuntarily committed by his family. Was in restraints at presentation. Reported to have been very agitated at home. Was out in the courtyard naked. He was threatening to shoot himself. His wife left him two days earlier. Patient had been drinking heavily. BAL was 327 mg/dl at presentation. UDS was positive for THC. CK was markedly elevated. Patient was treated at the ER. CK is trending down and now below one thousand. Renal function is normal.   Chart reviewed today. Patient discussed at team  Staff reports that patient has been appropriate on the unit. Patient has been interacting well with peers. No behavioral issues. Patient has not voiced any suicidal thoughts. Patient has not been observed to be internally stimulated. Patient has been adherent with treatment recommendations. Patient has been tolerating their medication well.    Seen today. Says he is feeling good. Has been busy with word puzzles. No sweatiness, no headaches. No retching, nausea or vomiting. No fullness in the head. No visual, tactile or auditory hallucination. No internal restlessness. No suicidal or homicidal thoughts. States that his wife has gotten rid of all the alcohol at home. Says he was at the [place] meeting here and related well with the speaker. Wants to speaker to be his sponsor or help him get one. Not invested in inpatient addiction treatment.   Principal Problem: MDD (major depressive disorder) Diagnosis:   Patient Active Problem List   Diagnosis Date Noted  . MDD (major depressive disorder) [F32.9] 09/25/2016  . Toxic encephalopathy [G92] 09/24/2016  . Acute encephalopathy [G93.40] 09/23/2016  . Suicidal ideation [R45.851] 09/23/2016  . Rhabdomyolysis [M62.82] 09/23/2016  . Normochromic normocytic anemia [D64.9] 09/23/2016  . ETOH  abuse [F10.10] 09/23/2016  . Depression [F32.9] 09/23/2016  . Alcohol dependence with alcohol-induced mood disorder ([place]) [F10.24] 09/23/2016  . Dehydration [E86.0]    Total Time spent with patient: 20 minutes  Past Psychiatric History: As in H&P  Past Medical History:  Past Medical History:  Diagnosis Date  . Depression   . Hypertension    History reviewed. No pertinent surgical history. Family History:  Family History  Problem Relation Age of Onset  . Family history unknown: Yes   Family Psychiatric  History: As in H&P Social History:  History  Alcohol Use  . 7.2 - 11.4 oz/week  . 5 - 7 Glasses of wine, 7 - 12 Cans of beer per week    Comment: per family 'he's an alcoholic'     History  Drug Use No    Social History   Social History  . Marital status: Unknown    Spouse name: N/A  . Number of children: N/A  . Years of education: N/A   Social History Main Topics  . Smoking status: Never Smoker  . Smokeless tobacco: Never Used  . Alcohol use 7.2 - 11.4 oz/week    5 - 7 Glasses of wine, 7 - 12 Cans of beer per week     Comment: per family 'he's an alcoholic'  . Drug use: No  . Sexual activity: Not Currently   Other Topics Concern  . None   Social History Narrative  . None   Additional Social History:    Pain Medications: none Longest period of sobriety (when/how long): unknown Negative Consequences of Use: Personal relationships, Work / Youth worker, Scientist, research (physical sciences), Tax inspector  Symptoms: Agitation, Blackouts       Sleep: Good  Appetite:  Good  Current Medications: Current Facility-Administered Medications  Medication Dose Route Frequency Provider Last Rate Last Dose  . acetaminophen (TYLENOL) tablet 650 mg  650 mg Oral Q6H PRN Okonkwo, Justina A, NP      . alum & mag hydroxide-simeth (MAALOX/MYLANTA) 200-200-20 MG/5ML suspension 30 mL  30 mL Oral Q4H PRN Okonkwo, Justina A, NP      . calcium carbonate (TUMS - dosed in mg elemental calcium) chewable  tablet 200 mg of elemental calcium  1 tablet Oral TID PRN Lu Duffel, Justina A, NP      . hydrOXYzine (ATARAX/VISTARIL) tablet 25 mg  25 mg Oral TID PRN Okonkwo, Justina A, NP      . loperamide (IMODIUM) capsule 2-4 mg  2-4 mg Oral PRN Okonkwo, Justina A, NP      . LORazepam (ATIVAN) tablet 1 mg  1 mg Oral Q6H PRN Okonkwo, Justina A, NP   1 mg at 09/27/16 2111  . LORazepam (ATIVAN) tablet 1 mg  1 mg Oral BID Okonkwo, Justina A, NP   1 mg at 09/28/16 0813   Followed by  . [START ON 09/29/2016] LORazepam (ATIVAN) tablet 1 mg  1 mg Oral Daily Okonkwo, Justina A, NP      . magnesium hydroxide (MILK OF MAGNESIA) suspension 30 mL  30 mL Oral Daily PRN Okonkwo, Justina A, NP      . metoprolol succinate (TOPROL-XL) 24 hr tablet 50 mg  50 mg Oral Daily Okonkwo, Justina A, NP   50 mg at 09/28/16 0813  . multivitamin with minerals tablet 1 tablet  1 tablet Oral Daily Okonkwo, Justina A, NP   1 tablet at 09/28/16 0813  . naltrexone (DEPADE) tablet 50 mg  50 mg Oral Daily Ailin Rochford, Laruth Bouchard, MD   50 mg at 09/28/16 0813  . ondansetron (ZOFRAN-ODT) disintegrating tablet 4 mg  4 mg Oral Q6H PRN Okonkwo, Justina A, NP      . sertraline (ZOLOFT) tablet 150 mg  150 mg Oral Daily Okonkwo, Justina A, NP   150 mg at 09/28/16 0813  . thiamine (VITAMIN B-1) tablet 100 mg  100 mg Oral Daily Okonkwo, Justina A, NP   100 mg at 09/28/16 0813  . traZODone (DESYREL) tablet 50 mg  50 mg Oral QHS Okonkwo, Justina A, NP   50 mg at 09/27/16 2111    Lab Results:  Results for orders placed or performed during the hospital encounter of 09/25/16 (from the past 48 hour(s))  CK     Status: None   Collection Time: 09/27/16  6:04 PM  Result Value Ref Range   Total CK 172 49 - 397 U/L    Comment: Performed at [facility], [place] [address], [place], [place] [address]    Blood Alcohol level:  Lab Results  Component Value Date   ETH [facility]) 23/76/2831    Metabolic Disorder Labs: No results found for: HGBA1C,  MPG No results found for: PROLACTIN No results found for: CHOL, TRIG, HDL, CHOLHDL, VLDL, LDLCALC  Physical Findings: AIMS: Facial and Oral Movements Muscles of Facial Expression: None, normal Lips and Perioral Area: None, normal Jaw: None, normal Tongue: None, normal,Extremity Movements Upper (arms, wrists, hands, fingers): None, normal Lower (legs, knees, ankles, toes): None, normal, Trunk Movements Neck, shoulders, hips: None, normal, Overall Severity Severity of abnormal movements (highest score from questions above): None, normal Incapacitation due to abnormal movements: None, normal  Patient's awareness of abnormal movements (rate only patient's report): No Awareness, Dental Status Current problems with teeth and/or dentures?: No Does patient usually wear dentures?: No  CIWA:  CIWA-Ar Total: 4 COWS:  COWS Total Score: 2  Musculoskeletal: Strength & Muscle Tone: within normal limits Gait & Station: normal Patient leans: N/A  Psychiatric Specialty Exam: Physical Exam  Constitutional: He is oriented to person, place, and time. No distress.  HENT:  Head: Normocephalic and atraumatic.  Eyes: Conjunctivae are normal. Pupils are equal, round, and reactive to light.  Neck: Normal range of motion. Neck supple.  Cardiovascular: Normal rate and regular rhythm.   Respiratory: Effort normal and breath sounds normal.  GI: Soft. Bowel sounds are normal.  Musculoskeletal: Normal range of motion.  Neurological: He is alert and oriented to person, place, and time.  Skin: Skin is warm and dry. He is not diaphoretic.  Psychiatric:  As above    ROS  Blood pressure 116/77, pulse 93, temperature 98.4 F (36.9 C), resp. rate 16, height 5\' 8"  (1.727 m), weight 73.9 kg (163 lb), SpO2 99 %.Body mass index is 24.78 kg/m.  General Appearance: Neatly dressed. Not shaky, not sweaty, not confused. Not unsteady, normal conjugate eye movements. Not internally distressed. Appropriate behavior.   Eye  Contact:  Good  Speech:  Clear and Coherent and Normal Rate  Volume:  Normal  Mood:  Euthymic  Affect:  Appropriate and Full Range  Thought Process:  Goal Directed and Linear  Orientation:  Full (Time, Place, and Person)  Thought Content:  No delusional theme. No preoccupation with violent thoughts. No negative ruminations. No obsession.  No hallucination in any modality.   Suicidal Thoughts:  No  Homicidal Thoughts:  No  Memory:  Immediate;   Good Recent;   Fair Remote;   Good  Judgement:  Fair  Insight:  Fair  Psychomotor Activity:  Normal  Concentration:  Concentration: Good and Attention Span: Good  Recall:  [place] of Knowledge:  Good  Language:  Good  Akathisia:  Negative  Handed:    AIMS (if indicated):     Assets:  Communication Skills Desire for Improvement Housing Intimacy Physical Health Resilience  ADL's:  Intact  Cognition:  WNL  Sleep:  Number of Hours: 6.25     Treatment Plan Summary: Patient is coming off psychoactive substances. CK is normalizing. No evidence of psychosis. No evidence of mania. No suicidal or homicidal thoughts. He is processing change. Needs further evaluation and encouragement. Hopeful discharge in a day or two.   Psychiatric: SUD (alcohol and THC) MDD  Medical: HTN  Psychosocial:   Marital conflict  PLAN: 1. Continue current regimen 2. Continue to monitor mood, behavior and interaction with peers 3. SW would gather collateral and finalize aftercare.   [place], MD 09/28/2016, 2:31 PM

## 2016-09-28 NOTE — [facility] Group Notes (Signed)
PT SUMMARY   Johnny Newton was present throughout group.  In conversation with chaplain in hall, he was unsure if he would attend.  After group description, he decided to attend group and expressed thanks afterward.  He was attentive throughout group as evidenced by eye contact with speakers and body language.  Did not engage in group dialog    GROUP SUMMARY   Pt attended spiritual care group on grief and loss facilitated by chaplain Jerene Pitch   Group opened with brief discussion and psycho-social ed around grief and loss in relationships and in relation to self - identifying life patterns, circumstances, changes that cause losses. Established group norm of speaking from own life experience. Group goal of establishing open and affirming space for members to share loss and experience with grief, normalize grief experience and provide psycho social education and grief support.   [facility] / [place], [place]  Page [phone]  Office [phone]

## 2016-09-28 NOTE — Progress Notes (Signed)
Patient ID: Johnny Newton, male   DOB: 11-08-1955, 61 y.o.   MRN: [id number]  DAR: Pt. Denies SI/HI and A/V Hallucinations. He reports, "I actually feel great today." He reports sleep was good last night, his appetite is good, energy level is normal, and concentration is good. He rates depression, hopelessness, and anxiety 0/10. Patient does not report any pain or discomfort at this time. He denies withdrawal symptoms as well. Support and encouragement provided to the patient to come to staff with questions, concerns, or any needs. Scheduled medications administered to patient per physician's orders. Patient is receptive and cooperative. He is seen in the milieu and is interacting well with staff and peers. Q15 minute checks are maintained for safety.

## 2016-09-28 NOTE — Progress Notes (Signed)
Nursing Progress Note [id number]  D) Patient presents with bright affect and is pleasant and cooperative on the unit. Patient was seen visible in the milieu and attended group. Patient reports he feels "excellent" and that he had "a great conversation with the doctor today". Patient reports he has "a wife and chocolate lab waiting for me at home". Patient reports he is hopeful for discharge soon. Patient denies SI/HI/AVH or pain. Patient contracts for safety on the unit. Patient reports sleeping well with current regimen and denies need for sleep medication.  A)  Emotional support given. 1:1 interaction and active listening provided. No medications administered/requested this shift. Snacks and fluids provided. Opportunities for questions or concerns presented to patient. Patient encouraged to continue to work on treatment goals. Labs, vital signs and patient behavior monitored throughout shift. Patient safety maintained with q15 min safety checks. Low fall risk precautions in place and reviewed with patient; patient verbalized understanding.  R) Patient receptive to interaction with nurse. Patient remains safe on the unit at this time. Patient denies any adverse medication reactions at this time. Patient is resting in bed without complaints. Will continue to monitor.

## 2016-09-29 DIAGNOSIS — F1910 Other psychoactive substance abuse, uncomplicated: Secondary | ICD-10-CM

## 2016-09-29 DIAGNOSIS — F1220 Cannabis dependence, uncomplicated: Secondary | ICD-10-CM | POA: Diagnosis present

## 2016-09-29 DIAGNOSIS — F1010 Alcohol abuse, uncomplicated: Secondary | ICD-10-CM

## 2016-09-29 DIAGNOSIS — F1020 Alcohol dependence, uncomplicated: Secondary | ICD-10-CM | POA: Diagnosis present

## 2016-09-29 DIAGNOSIS — F339 Major depressive disorder, recurrent, unspecified: Secondary | ICD-10-CM

## 2016-09-29 MED ORDER — SERTRALINE HCL 50 MG PO TABS: 150.0000 mg | ORAL_TABLET | Freq: Every day | ORAL | 0 refills | Status: DC

## 2016-09-29 MED ORDER — CALCIUM CARBONATE ANTACID 500 MG PO CHEW: 1.0000 | CHEWABLE_TABLET | Freq: Three times a day (TID) | ORAL | Status: DC | PRN

## 2016-09-29 MED ORDER — NALTREXONE HCL 50 MG PO TABS: 50.0000 mg | ORAL_TABLET | Freq: Every day | ORAL | 0 refills | Status: DC

## 2016-09-29 MED ORDER — METOPROLOL SUCCINATE ER 50 MG PO TB24: 50.0000 mg | ORAL_TABLET | Freq: Every day | ORAL | 0 refills | Status: DC

## 2016-09-29 MED ORDER — TRAZODONE HCL 50 MG PO TABS: 50.0000 mg | ORAL_TABLET | Freq: Every day | ORAL | 0 refills | Status: DC

## 2016-09-29 MED ORDER — HYDROXYZINE HCL 25 MG PO TABS: 25.0000 mg | ORAL_TABLET | Freq: Three times a day (TID) | ORAL | 0 refills | Status: DC | PRN

## 2016-09-29 NOTE — Tx Team (Signed)
Interdisciplinary Treatment and Diagnostic Plan Update  09/29/2016 Time of Session: 0930 Johnny Newton MRN: [id number]  Principal Diagnosis: MDD (major depressive disorder)  Secondary Diagnoses: Principal Problem:   MDD (major depressive disorder)   Current Medications:  Current Facility-Administered Medications  Medication Dose Route Frequency Provider Last Rate Last Dose  . acetaminophen (TYLENOL) tablet 650 mg  650 mg Oral Q6H PRN Okonkwo, Justina A, NP      . alum & mag hydroxide-simeth (MAALOX/MYLANTA) 200-200-20 MG/5ML suspension 30 mL  30 mL Oral Q4H PRN Okonkwo, Justina A, NP      . calcium carbonate (TUMS - dosed in mg elemental calcium) chewable tablet 200 mg of elemental calcium  1 tablet Oral TID PRN Lu Duffel, Justina A, NP      . hydrOXYzine (ATARAX/VISTARIL) tablet 25 mg  25 mg Oral TID PRN Lu Duffel, Justina A, NP      . magnesium hydroxide (MILK OF MAGNESIA) suspension 30 mL  30 mL Oral Daily PRN Okonkwo, Justina A, NP      . metoprolol succinate (TOPROL-XL) 24 hr tablet 50 mg  50 mg Oral Daily Okonkwo, Justina A, NP   50 mg at 09/29/16 0758  . multivitamin with minerals tablet 1 tablet  1 tablet Oral Daily Okonkwo, Justina A, NP   1 tablet at 09/29/16 0758  . naltrexone (DEPADE) tablet 50 mg  50 mg Oral Daily Izediuno, Laruth Bouchard, MD   50 mg at 09/29/16 0758  . sertraline (ZOLOFT) tablet 150 mg  150 mg Oral Daily Okonkwo, Justina A, NP   150 mg at 09/29/16 0758  . thiamine (VITAMIN B-1) tablet 100 mg  100 mg Oral Daily Okonkwo, Justina A, NP   100 mg at 09/29/16 0758  . traZODone (DESYREL) tablet 50 mg  50 mg Oral QHS Okonkwo, Justina A, NP   50 mg at 09/27/16 2111   PTA Medications: Prescriptions Prior to Admission  Medication Sig Dispense Refill Last Dose  . sertraline (ZOLOFT) 100 MG tablet Take 150 mg by mouth daily.  1 Unknown at Unknown time  . traZODone (DESYREL) 50 MG tablet Take 1 tablet (50 mg total) by mouth at bedtime. 30 tablet 0 Unknown at Unknown time  .  [DISCONTINUED] metoprolol succinate (TOPROL-XL) 50 MG 24 hr tablet Take 1 tablet by mouth daily.  1 Unknown at Unknown time    Patient Stressors: Educational concerns Financial difficulties Health problems Legal issue  Patient Strengths: Ability for insight Active sense of humor Average or above average intelligence  Treatment Modalities: Medication Management, Group therapy, Case management,  1 to 1 session with clinician, Psychoeducation, Recreational therapy.   Physician Treatment Plan for Primary Diagnosis: MDD (major depressive disorder) Long Term Goal(s): Improvement in symptoms so as ready for discharge Improvement in symptoms so as ready for discharge   Short Term Goals: Ability to identify changes in lifestyle to reduce recurrence of condition will improve Ability to verbalize feelings will improve Ability to disclose and discuss suicidal ideas Ability to demonstrate self-control will improve Ability to identify and develop effective coping behaviors will improve Ability to maintain clinical measurements within normal limits will improve Compliance with prescribed medications will improve Ability to identify triggers associated with substance abuse/mental health issues will improve  Medication Management: Evaluate patient's response, side effects, and tolerance of medication regimen.  Therapeutic Interventions: 1 to 1 sessions, Unit Group sessions and Medication administration.  Evaluation of Outcomes: Met  Physician Treatment Plan for Secondary Diagnosis: Principal Problem:   MDD (major depressive disorder)  Long Term Goal(s): Improvement in symptoms so as ready for discharge Improvement in symptoms so as ready for discharge   Short Term Goals: Ability to identify changes in lifestyle to reduce recurrence of condition will improve Ability to verbalize feelings will improve Ability to disclose and discuss suicidal ideas Ability to demonstrate self-control will  improve Ability to identify and develop effective coping behaviors will improve Ability to maintain clinical measurements within normal limits will improve Compliance with prescribed medications will improve Ability to identify triggers associated with substance abuse/mental health issues will improve     Medication Management: Evaluate patient's response, side effects, and tolerance of medication regimen.  Therapeutic Interventions: 1 to 1 sessions, Unit Group sessions and Medication administration.  Evaluation of Outcomes: Met   RN Treatment Plan for Primary Diagnosis: MDD (major depressive disorder) Long Term Goal(s): Knowledge of disease and therapeutic regimen to maintain health will improve  Short Term Goals: Ability to remain free from injury will improve, Ability to disclose and discuss suicidal ideas and Ability to identify and develop effective coping behaviors will improve  Medication Management: RN will administer medications as ordered by provider, will assess and evaluate patient's response and provide education to patient for prescribed medication. RN will report any adverse and/or side effects to prescribing provider.  Therapeutic Interventions: 1 on 1 counseling sessions, Psychoeducation, Medication administration, Evaluate responses to treatment, Monitor vital signs and CBGs as ordered, Perform/monitor CIWA, COWS, AIMS and Fall Risk screenings as ordered, Perform wound care treatments as ordered.  Evaluation of Outcomes: Met   LCSW Treatment Plan for Primary Diagnosis: MDD (major depressive disorder) Long Term Goal(s): Safe transition to appropriate next level of care at discharge, Engage patient in therapeutic group addressing interpersonal concerns.  Short Term Goals: Engage patient in aftercare planning with referrals and resources, Facilitate patient progression through stages of change regarding substance use diagnoses and concerns and Identify triggers associated  with mental health/substance abuse issues  Therapeutic Interventions: Assess for all discharge needs, 1 to 1 time with Social worker, Explore available resources and support systems, Assess for adequacy in community support network, Educate family and significant other(s) on suicide prevention, Complete Psychosocial Assessment, Interpersonal group therapy.  Evaluation of Outcomes: Met   Progress in Treatment: Attending groups: Yes. Participating in groups: Yes. Taking medication as prescribed: Yes. Toleration medication: Yes. Family/Significant other contact made: SPE completed with pt's wife.  Patient understands diagnosis: Yes. Discussing patient identified problems/goals with staff: Yes. Medical problems stabilized or resolved: Yes. Denies suicidal/homicidal ideation: Yes. Issues/concerns per patient self-inventory: No. Other: n/a  New problem(s) identified: No, Describe:  n/a  New Short Term/Long Term Goal(s): detox, medication stabilization, development of comprehensive mental wellness/sobriety plan.   Discharge Plan or Barriers: Pt has PCP follow-up appt scheduled with Dr. Zadie Rhine. He was given [facility] information and encouraged to call for assessment. Wife also told about [facility] services. Pt provided with AA list for [place] and [employer].   Reason for Continuation of Hospitalization: none  Estimated Length of Stay: discharge today   Attendees: Patient: 09/29/2016 10:35 AM  Physician: Dr. Shea Evans MD 09/29/2016 10:35 AM  Nursing: Samul Dada RN 09/29/2016 10:35 AM  RN Care Manager: Lars Pinks CM 09/29/2016 10:35 AM  Social Worker: Maxie Better, LCSW 09/29/2016 10:35 AM  Recreational Therapist: x 09/29/2016 10:35 AM  Other: Lindell Spar NP 09/29/2016 10:35 AM  Other:  09/29/2016 10:35 AM  Other: 09/29/2016 10:35 AM    Scribe for Treatment Team: Kimber Relic Smart, LCSW  09/29/2016 10:35 AM

## 2016-09-29 NOTE — BHH Suicide Risk Assessment (Signed)
[place] INPATIENT:  Family/Significant Other Suicide Prevention Education  Suicide Prevention Education:  Education Completed; Johnny Newton (pt's wife) [phone] has been identified by the patient as the family member/significant other with whom the patient will be residing, and identified as the person(s) who will aid the patient in the event of a mental health crisis (suicidal ideations/suicide attempt).  With written consent from the patient, the family member/significant other has been provided the following suicide prevention education, prior to the and/or following the discharge of the patient.  The suicide prevention education provided includes the following:  Suicide risk factors  Suicide prevention and interventions  National Suicide Hotline telephone number  [facility] assessment telephone number  [facility] Emergency Assistance [place] and/or Residential Mobile Crisis Unit telephone number  Request made of family/significant other to:  Remove weapons (e.g., guns, rifles, knives), all items previously/currently identified as safety concern.    Remove drugs/medications (over-the-counter, prescriptions, illicit drugs), all items previously/currently identified as a safety concern.  The family member/significant other verbalizes understanding of the suicide prevention education information provided.  The family member/significant other agrees to remove the items of safety concern listed above.  Brenn Gatton N Smart LCSW 09/29/2016, 10:33 AM

## 2016-09-29 NOTE — [facility] Suicide Risk Assessment (Signed)
[facility] Discharge Suicide Risk Assessment   Principal Problem: Major depressive disorder, recurrent episode with anxious distress [facility]) Discharge Diagnoses:  Patient Active Problem List   Diagnosis Date Noted  . Alcohol use disorder, severe, dependence ([place]) [F10.20] 09/29/2016  . Cannabis use disorder, severe, dependence ([place]) [F12.20] 09/29/2016  . Major depressive disorder, recurrent episode with anxious distress ([place]) [F33.9] 09/29/2016  . Toxic encephalopathy [G92] 09/24/2016  . Acute encephalopathy [G93.40] 09/23/2016  . Suicidal ideation [R45.851] 09/23/2016  . Rhabdomyolysis [M62.82] 09/23/2016  . Normochromic normocytic anemia [D64.9] 09/23/2016  . ETOH abuse [F10.10] 09/23/2016  . Depression [F32.9] 09/23/2016  . Alcohol dependence with alcohol-induced mood disorder ([place]) [F10.24] 09/23/2016  . Dehydration [E86.0]     Total Time spent with patient: 30 minutes  Musculoskeletal: Strength & Muscle Tone: within normal limits Gait & Station: normal Patient leans: N/A  Psychiatric Specialty Exam: Review of Systems  Psychiatric/Behavioral: Positive for substance abuse. Negative for depression, hallucinations and suicidal ideas. The patient is not nervous/anxious.   All other systems reviewed and are negative.   Blood pressure 118/87, pulse 71, temperature 98.8 F (37.1 C), temperature source Oral, resp. rate 18, height 5\' 8"  (1.727 m), weight 73.9 kg (163 lb), SpO2 99 %.Body mass index is 24.78 kg/m.  General Appearance: Casual  Eye Contact::  Fair  Speech:  Clear and Coherent409  Volume:  Normal  Mood:  Euthymic  Affect:  Congruent  Thought Process:  Goal Directed and Descriptions of Associations: Intact  Orientation:  Full (Time, Place, and Person)  Thought Content:  Logical  Suicidal Thoughts:  No  Homicidal Thoughts:  No  Memory:  Immediate;   Fair Recent;   Good Remote;   Good  Judgement:  Fair  Insight:  Fair  Psychomotor Activity:  Normal  Concentration:   Fair  Recall:  [employer] of Knowledge:Fair  Language: Fair  Akathisia:  No  Handed:  Right  AIMS (if indicated):     Assets:  Communication Skills Desire for Improvement  Sleep:  Number of Hours: 6.75  Cognition: WNL  ADL's:  Intact   Mental Status Per Nursing Assessment::   On Admission:     Demographic Factors:  Male and Caucasian  Loss Factors: NA  Historical Factors: Impulsivity  Risk Reduction Factors:   Positive social support  Continued Clinical Symptoms:  Alcohol/Substance Abuse/Dependencies  Cognitive Features That Contribute To Risk:  None    Suicide Risk:  Minimal: No identifiable suicidal ideation.  Patients presenting with no risk factors but with morbid ruminations; may be classified as minimal risk based on the severity of the depressive symptoms  Follow-up Information    Rankins, Bill Salinas, MD Follow up on 10/05/2016.   Specialty:  Family Medicine Why:  Appt with Dr. Radene Ou at 2:30PM for hospital follow-up. Please bring information packet from hospital. Thank you.  Contact information: [place] [id number] [phone]           Plan Of Care/Follow-up recommendations:  Activity:  no restrictions Diet:  regular Tests:  follow up with PMD for CK, CMP levels Other:  none  Braley Luckenbaugh, MD 09/29/2016, 11:33 AM

## 2016-09-29 NOTE — BHH Group Notes (Signed)
[place] Group Notes:  (Nursing/MHT/Case Management/Adjunct)  Date:  09/29/2016  Time:  0900  Type of Therapy:  Nurse Education - Successful Goal Setting  Participation Level:  Did Not Attend  Participation Quality:    Affect:    Cognitive:    Insight:    Engagement in Group:    Modes of Intervention:    Summary of Progress/Problems: Patient invited but did not attend.  Loletta Specter [facility] 09/29/2016, 0930

## 2016-09-29 NOTE — Progress Notes (Addendum)
  [facility] Adult Case Management Discharge Plan :  Will you be returning to the same living situation after discharge:  Yes,  home with wife At discharge, do you have transportation home?: Yes,  wife will pick him up at 1pm Do you have the ability to pay for your medications: Yes,  [employer] private insurance  Release of information consent forms completed and submitted to medical records by CSW.  Patient to Follow up at: Follow-up Information    Rankins, Bill Salinas, MD Follow up on 10/05/2016.   Specialty:  Family Medicine Why:  Appt with Dr. Radene Ou at 2:30PM for hospital follow-up. Please bring information packet from hospital. Thank you. Patient declined mental health follow up.  Contact information: [place]  [phone]           Next level of care provider has access to [place] and Suicide Prevention discussed: Yes,  SPE completed with pt's wife. SPI pamphlet and Mobile Crisis information provided to pt.   Have you used any form of tobacco in the last 30 days? (Cigarettes, Smokeless Tobacco, Cigars, and/or Pipes): No  Has patient been referred to the Quitline?: N/A patient is not a smoker  Patient has been referred for addiction treatment: Yes  Beverely Pace LCSW 09/29/2016, 3:35 PM

## 2016-09-29 NOTE — Discharge Summary (Signed)
Physician Discharge Summary Note  Patient:  Johnny Newton is an 61 y.o., male MRN:  [id number] DOB:  06/25/1955 Patient phone:  [phone] (home)  Patient address:   [place] [address],  Total Time spent with patient: Greater than 30 minutes  Date of Admission:  09/25/2016 Date of Discharge: 09-29-16  Reason for Admission: Bizarre & behavior & threats of suicide.  Principal Problem: MDD (major depressive disorder)  Discharge Diagnoses: Patient Active Problem List   Diagnosis Date Noted  . Alcohol dependence with alcohol-induced mood disorder ([place]) [F10.24] 09/23/2016    Priority: High  . MDD (major depressive disorder) [F32.9] 09/25/2016    Priority: Medium  . Toxic encephalopathy [G92] 09/24/2016  . Acute encephalopathy [G93.40] 09/23/2016  . Suicidal ideation [R45.851] 09/23/2016  . Rhabdomyolysis [M62.82] 09/23/2016  . Normochromic normocytic anemia [D64.9] 09/23/2016  . ETOH abuse [F10.10] 09/23/2016  . Depression [F32.9] 09/23/2016  . Dehydration [E86.0]    Past Psychiatric History: Alcohol use disorder, Major depression.  Past Medical History:  Past Medical History:  Diagnosis Date  . Depression   . Hypertension    History reviewed. No pertinent surgical history.  Family History:  Family History  Problem Relation Age of Onset  . Family history unknown: Yes   Family Psychiatric  History: See H&P  Social History:  History  Alcohol Use  . 7.2 - 11.4 oz/week  . 5 - 7 Glasses of wine, 7 - 12 Cans of beer per week    Comment: per family 'he's an alcoholic'     History  Drug Use No    Social History   Social History  . Marital status: Unknown    Spouse name: N/A  . Number of children: N/A  . Years of education: N/A   Social History Main Topics  . Smoking status: Never Smoker  . Smokeless tobacco: Never Used  . Alcohol use 7.2 - 11.4 oz/week    5 - 7 Glasses of wine, 7 - 12 Cans of beer per week     Comment: per family 'he's an  alcoholic'  . Drug use: No  . Sexual activity: Not Currently   Other Topics Concern  . None   Social History Narrative  . None   Hospital Course: (Per admission notes): Johnny Newton is a 61 year old Caucasian, self-employed, married. Background history of SUD (alcohol and THC). Involuntarily committed by his family. Was in restraints at presentation. Reported to have been very agitated at home. Was out in the courtyard naked. He was threatening to shoot himself. His wife left him two days earlier. Patient had been drinking heavily. BAL was 327 mg/dl at presentation. UDS was positive for THC. CK was markedly elevated. Patient was treated at the ER. CK is trending down and now below one thousand. Renal function is normal.  At interview, reports long history of alcohol use. Binge pattern of drinking. Became depressed about a year ago. 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 Now lives on his savings. Has been drinking a bit more. Says he was drunk and acted inappropriately. Says he wants to go off alcohol. No past treatment. Attended AA yesterday and liked it. Says he wants his family back. His wife came to visit him in hospital. Willing to take Naltrexone after we explored the risk and benefits. On sertraline from his PCP. Notes it's helpful. Says depression is associated with binges of alcohol. He is fine if he does not drink. No past psychiatry hospitalization. No past  suicidal behavior. No family history of mental illness or suicide.  Has a couple of pistols at home. Says his brother has taken them away.  After the above admission evaluation & review of his toxicology test reports, it was determined that Johnny Newton will need Alcohol detoxification as well as mood stabilization treatment. This is to stabilize his mood & system of alcohol intoxication. He received Ativan detox regimen for alcohol detox, He was also medicated & discharged on; Naltrexone 50 mg for alcoholism, Sertraline 150 mg for depression &  Trazodone 50 mg for insomnia. He was enrolled & participated in the group counseling sessions being offered & held on this unit. He learned coping skills. He received no other medication regimen as presented no other pre-existing medical issues. He tolerated his treatment regimen without any adverse effects or reactions reported.  Johnny Newton is seen today by his attending psychiatrist for discharge. He has normal anxiety about going home. He is not overwhelmed by this. He is looking forward to working on his addiction. Not expressing any delusions today. No hallucination. Feels in control of himself. No passivity of thought. No passivity of will. No fantasy about suicide lately. No suicidal thoughts. Looking forward to creating something that his will be doing with his wife at leisure times, such as cooking. No thoughts of violence. No craving for drugs or alcohol. Does not feel depressed. No evidence of mania.  Nursing staff reports that patient has been appropriate on the unit. Patient has been interacting well with peers. No behavioral issues. Patient has not voiced any suicidal thoughts. Patient has not been observed to be internally stimulated or preoccupied. Patient has been adherent to his treatment recommendations. Patient has been tolerating tolerating medication well. No reported adverse effects or reactions.   Patient was discussed at the treatment team meeting this morning. Team members feels that patient is back to his baseline level of function. Team agrees with plan to discharge patient today to continue treatment on an outpatient basis as noted below. He left [facility] with all personal belongings in no apparent distress. Transportation per wife.    Physical Findings: AIMS: Facial and Oral Movements Muscles of Facial Expression: None, normal Lips and Perioral Area: None, normal Jaw: None, normal Tongue: None, normal,Extremity Movements Upper (arms, wrists, hands, fingers): None, normal Lower  (legs, knees, ankles, toes): None, normal, Trunk Movements Neck, shoulders, hips: None, normal, Overall Severity Severity of abnormal movements (highest score from questions above): None, normal Incapacitation due to abnormal movements: None, normal Patient's awareness of abnormal movements (rate only patient's report): No Awareness, Dental Status Current problems with teeth and/or dentures?: No Does patient usually wear dentures?: No  CIWA:  CIWA-Ar Total: 0 COWS:  COWS Total Score: 2  Musculoskeletal: Strength & Muscle Tone: within normal limits Gait & Station: normal Patient leans: N/A  Psychiatric Specialty Exam: Physical Exam  Constitutional: He is oriented to person, place, and time. He appears well-developed.  HENT:  Head: Normocephalic.  Eyes: Pupils are equal, round, and reactive to light.  Neck: Normal range of motion.  Cardiovascular: Normal rate.   Respiratory: Effort normal.  GI: Soft.  Genitourinary:  Genitourinary Comments: Deferred  Musculoskeletal: Normal range of motion.  Neurological: He is alert and oriented to person, place, and time.  Skin: Skin is warm and dry.    Review of Systems  Constitutional: Negative.   HENT: Negative.   Eyes: Negative.   Respiratory: Negative.   Cardiovascular: Negative.   Gastrointestinal: Negative.   Genitourinary: Negative.  Musculoskeletal: Negative.   Skin: Negative.   Neurological: Negative.   Endo/Heme/Allergies: Negative.   Psychiatric/Behavioral: Positive for depression (Stable) and substance abuse (Hx. alcoholism). Negative for hallucinations, memory loss and suicidal ideas. The patient has insomnia (Stable). The patient is not nervous/anxious.     Blood pressure 118/87, pulse 71, temperature 98.8 F (37.1 C), temperature source Oral, resp. rate 18, height 5\' 8"  (1.727 m), weight 73.9 kg (163 lb), SpO2 99 %.Body mass index is 24.78 kg/m.  See Md's SRA   Have you used any form of tobacco in the last 30 days?  (Cigarettes, Smokeless Tobacco, Cigars, and/or Pipes): No  Has this patient used any form of tobacco in the last 30 days? (Cigarettes, Smokeless Tobacco, Cigars, and/or Pipes): No  Blood Alcohol level:  Lab Results  Component Value Date   ETH 327 (HH) 94/76/5465   Metabolic Disorder Labs:  No results found for: HGBA1C, MPG No results found for: PROLACTIN No results found for: CHOL, TRIG, HDL, CHOLHDL, VLDL, LDLCALC  See Psychiatric Specialty Exam and Suicide Risk Assessment completed by Attending Physician prior to discharge.  Discharge destination:  Home  Is patient on multiple antipsychotic therapies at discharge:  No   Has Patient had three or more failed trials of antipsychotic monotherapy by history:  No  Recommended Plan for Multiple Antipsychotic Therapies: NA  Allergies as of 09/29/2016   No Known Allergies     Medication List    TAKE these medications     Indication  calcium carbonate 500 MG chewable tablet Commonly known as:  TUMS - dosed in mg elemental calcium Chew 1 tablet (200 mg of elemental calcium total) by mouth 3 (three) times daily as needed for indigestion or heartburn.  Indication:  Acid Indigestion   hydrOXYzine 25 MG tablet Commonly known as:  ATARAX/VISTARIL Take 1 tablet (25 mg total) by mouth 3 (three) times daily as needed for anxiety.  Indication:  Anxiety Neurosis   metoprolol succinate 50 MG 24 hr tablet Commonly known as:  TOPROL-XL Take 1 tablet (50 mg total) by mouth daily. For high blood pressure What changed:  additional instructions  Indication:  High Blood Pressure Disorder   naltrexone 50 MG tablet Commonly known as:  DEPADE Take 1 tablet (50 mg total) by mouth daily. For alcoholism Start taking on:  09/30/2016  Indication:  Excessive Use of Alcohol   sertraline 50 MG tablet Commonly known as:  ZOLOFT Take 3 tablets (150 mg total) by mouth daily. For depression Start taking on:  09/30/2016 What changed:  medication  strength  additional instructions  Indication:  Major Depressive Disorder   traZODone 50 MG tablet Commonly known as:  DESYREL Take 1 tablet (50 mg total) by mouth at bedtime. For insomnia What changed:  additional instructions  Indication:  Imboden, Bill Salinas, MD Follow up on 10/05/2016.   Specialty:  Family Medicine Why:  Appt with Dr. Radene Ou at 2:30PM for hospital follow-up. Please bring information packet from hospital. Thank you.  Contact information: [place] [address] [phone]          Follow-up recommendations: Activity:  As tolerated Diet: As recommended by your primary care doctor. Keep all scheduled follow-up appointments as recommended.   Comments: Patient is instructed prior to discharge to: Take all medications as prescribed by his/her mental healthcare provider. Report any adverse effects and or reactions from the medicines to his/her outpatient provider promptly.  Patient has been instructed & cautioned: To not engage in alcohol and or illegal drug use while on prescription medicines. In the event of worsening symptoms, patient is instructed to call the crisis hotline, 911 and or go to the nearest ED for appropriate evaluation and treatment of symptoms. To follow-up with his/her primary care provider for your other medical issues, concerns and or health care needs.   Signed: Encarnacion Slates, NP, PMHNP, FNP-BC 09/29/2016, 10:30 AM

## 2016-11-04 DIAGNOSIS — F324 Major depressive disorder, single episode, in partial remission: Secondary | ICD-10-CM | POA: Diagnosis not present

## 2016-11-04 DIAGNOSIS — F1020 Alcohol dependence, uncomplicated: Secondary | ICD-10-CM | POA: Diagnosis not present

## 2016-12-23 DIAGNOSIS — G5601 Carpal tunnel syndrome, right upper limb: Secondary | ICD-10-CM | POA: Diagnosis not present

## 2016-12-23 DIAGNOSIS — F1020 Alcohol dependence, uncomplicated: Secondary | ICD-10-CM | POA: Diagnosis not present

## 2016-12-23 DIAGNOSIS — L989 Disorder of the skin and subcutaneous tissue, unspecified: Secondary | ICD-10-CM | POA: Diagnosis not present

## 2016-12-23 DIAGNOSIS — F324 Major depressive disorder, single episode, in partial remission: Secondary | ICD-10-CM | POA: Diagnosis not present

## 2017-05-05 DIAGNOSIS — F1020 Alcohol dependence, uncomplicated: Secondary | ICD-10-CM | POA: Diagnosis not present

## 2017-05-05 DIAGNOSIS — F324 Major depressive disorder, single episode, in partial remission: Secondary | ICD-10-CM | POA: Diagnosis not present

## 2017-05-05 DIAGNOSIS — I10 Essential (primary) hypertension: Secondary | ICD-10-CM | POA: Diagnosis not present

## 2018-04-18 DIAGNOSIS — I10 Essential (primary) hypertension: Secondary | ICD-10-CM | POA: Diagnosis not present

## 2018-04-18 DIAGNOSIS — Z23 Encounter for immunization: Secondary | ICD-10-CM | POA: Diagnosis not present

## 2018-11-22 DIAGNOSIS — F324 Major depressive disorder, single episode, in partial remission: Secondary | ICD-10-CM | POA: Diagnosis not present

## 2018-11-22 DIAGNOSIS — F1010 Alcohol abuse, uncomplicated: Secondary | ICD-10-CM | POA: Diagnosis not present

## 2018-11-22 DIAGNOSIS — I10 Essential (primary) hypertension: Secondary | ICD-10-CM | POA: Diagnosis not present

## 2019-03-06 DIAGNOSIS — F324 Major depressive disorder, single episode, in partial remission: Secondary | ICD-10-CM | POA: Diagnosis not present

## 2019-03-06 DIAGNOSIS — F419 Anxiety disorder, unspecified: Secondary | ICD-10-CM | POA: Diagnosis not present

## 2019-03-06 DIAGNOSIS — I10 Essential (primary) hypertension: Secondary | ICD-10-CM | POA: Diagnosis not present

## 2019-07-25 DIAGNOSIS — L989 Disorder of the skin and subcutaneous tissue, unspecified: Secondary | ICD-10-CM | POA: Diagnosis not present

## 2019-08-22 DIAGNOSIS — C44319 Basal cell carcinoma of skin of other parts of face: Secondary | ICD-10-CM | POA: Diagnosis not present

## 2019-08-22 DIAGNOSIS — C4362 Malignant melanoma of left upper limb, including shoulder: Secondary | ICD-10-CM | POA: Diagnosis not present

## 2019-08-30 DIAGNOSIS — D485 Neoplasm of uncertain behavior of skin: Secondary | ICD-10-CM | POA: Diagnosis not present

## 2019-08-30 DIAGNOSIS — C4362 Malignant melanoma of left upper limb, including shoulder: Secondary | ICD-10-CM | POA: Diagnosis not present

## 2019-10-06 DIAGNOSIS — C44319 Basal cell carcinoma of skin of other parts of face: Secondary | ICD-10-CM | POA: Diagnosis not present

## 2019-11-08 ENCOUNTER — Other Ambulatory Visit: Payer: Self-pay

## 2019-11-08 ENCOUNTER — Encounter (HOSPITAL_COMMUNITY): Payer: Self-pay | Admitting: Emergency Medicine

## 2019-11-08 ENCOUNTER — Emergency Department (HOSPITAL_COMMUNITY)
Admission: EM | Admit: 2019-11-08 | Discharge: 2019-11-08 | Disposition: A | Discharge status: Home / Self Care | Payer: BC Managed Care – PPO | Attending: Emergency Medicine | Admitting: Emergency Medicine

## 2019-11-08 ENCOUNTER — Emergency Department (HOSPITAL_COMMUNITY): Payer: BC Managed Care – PPO

## 2019-11-08 DIAGNOSIS — Y9289 Other specified places as the place of occurrence of the external cause: Secondary | ICD-10-CM | POA: Diagnosis not present

## 2019-11-08 DIAGNOSIS — W208XXA Other cause of strike by thrown, projected or falling object, initial encounter: Secondary | ICD-10-CM | POA: Insufficient documentation

## 2019-11-08 DIAGNOSIS — Z79899 Other long term (current) drug therapy: Secondary | ICD-10-CM | POA: Insufficient documentation

## 2019-11-08 DIAGNOSIS — S81812A Laceration without foreign body, left lower leg, initial encounter: Secondary | ICD-10-CM

## 2019-11-08 DIAGNOSIS — Y998 Other external cause status: Secondary | ICD-10-CM | POA: Insufficient documentation

## 2019-11-08 DIAGNOSIS — R52 Pain, unspecified: Secondary | ICD-10-CM | POA: Diagnosis not present

## 2019-11-08 DIAGNOSIS — R58 Hemorrhage, not elsewhere classified: Secondary | ICD-10-CM | POA: Diagnosis not present

## 2019-11-08 DIAGNOSIS — I959 Hypotension, unspecified: Secondary | ICD-10-CM | POA: Diagnosis not present

## 2019-11-08 DIAGNOSIS — I10 Essential (primary) hypertension: Secondary | ICD-10-CM | POA: Insufficient documentation

## 2019-11-08 DIAGNOSIS — Y9389 Activity, other specified: Secondary | ICD-10-CM | POA: Diagnosis not present

## 2019-11-08 LAB — CBC WITH DIFFERENTIAL/PLATELET
Abs Immature Granulocytes: 0.02 10*3/uL (ref 0.00–0.07)
Basophils Absolute: 0.1 10*3/uL (ref 0.0–0.1)
Basophils Relative: 1 %
Eosinophils Absolute: 0 10*3/uL (ref 0.0–0.5)
Eosinophils Relative: 1 %
HCT: 36.9 % — ABNORMAL LOW (ref 39.0–52.0)
Hemoglobin: 11.8 g/dL — ABNORMAL LOW (ref 13.0–17.0)
Immature Granulocytes: 0 %
Lymphocytes Relative: 21 %
Lymphs Abs: 1.2 10*3/uL (ref 0.7–4.0)
MCH: 31.1 pg (ref 26.0–34.0)
MCHC: 32 g/dL (ref 30.0–36.0)
MCV: 97.1 fL (ref 80.0–100.0)
Monocytes Absolute: 0.4 10*3/uL (ref 0.1–1.0)
Monocytes Relative: 7 %
Neutro Abs: 4.1 10*3/uL (ref 1.7–7.7)
Neutrophils Relative %: 70 %
Platelets: 232 10*3/uL (ref 150–400)
RBC: 3.8 MIL/uL — ABNORMAL LOW (ref 4.22–5.81)
RDW: 15.8 % — ABNORMAL HIGH (ref 11.5–15.5)
WBC: 5.8 10*3/uL (ref 4.0–10.5)
nRBC: 0 % (ref 0.0–0.2)

## 2019-11-08 LAB — COMPREHENSIVE METABOLIC PANEL
ALT: 163 U/L — ABNORMAL HIGH (ref 0–44)
AST: 294 U/L — ABNORMAL HIGH (ref 15–41)
Albumin: 4.2 g/dL (ref 3.5–5.0)
Alkaline Phosphatase: 62 U/L (ref 38–126)
Anion gap: 21 — ABNORMAL HIGH (ref 5–15)
BUN: 9 mg/dL (ref 8–23)
CO2: 18 mmol/L — ABNORMAL LOW (ref 22–32)
Calcium: 8.9 mg/dL (ref 8.9–10.3)
Chloride: 101 mmol/L (ref 98–111)
Creatinine, Ser: 0.88 mg/dL (ref 0.61–1.24)
GFR calc Af Amer: >60 mL/min (ref >60)
GFR calc non Af Amer: >60 mL/min (ref >60)
Glucose, Bld: 100 mg/dL — ABNORMAL HIGH (ref 70–99)
Potassium: 3.4 mmol/L — ABNORMAL LOW (ref 3.5–5.1)
Sodium: 140 mmol/L (ref 135–145)
Total Bilirubin: 1.3 mg/dL — ABNORMAL HIGH (ref 0.3–1.2)
Total Protein: 6.5 g/dL (ref 6.5–8.1)

## 2019-11-08 LAB — APTT: aPTT: 28 seconds (ref 24–36)

## 2019-11-08 LAB — TYPE AND SCREEN
ABO/RH(D): O POS
Antibody Screen: NEGATIVE

## 2019-11-08 LAB — ABO/RH: ABO/RH(D): O POS

## 2019-11-08 MED ORDER — TETANUS-DIPHTH-ACELL PERTUSSIS 5-2.5-18.5 LF-MCG/0.5 IM SUSP: 0.5000 mL | Freq: Once | INTRAMUSCULAR | Status: DC

## 2019-11-08 MED ORDER — SODIUM CHLORIDE 0.9 % IV BOLUS
1000.0000 mL | Freq: Once | INTRAVENOUS | Status: AC
  Administered 2019-11-08: 1000 mL via INTRAVENOUS

## 2019-11-08 MED ORDER — BACITRACIN ZINC 500 UNIT/GM EX OINT: TOPICAL_OINTMENT | Freq: Two times a day (BID) | CUTANEOUS | Status: DC

## 2019-11-08 MED ORDER — CEPHALEXIN 500 MG PO CAPS
500.0000 mg | ORAL_CAPSULE | Freq: Three times a day (TID) | ORAL | 0 refills | Status: AC
Start: 2019-11-08 — End: 2019-11-13

## 2019-11-08 MED ORDER — LIDOCAINE-EPINEPHRINE 1 %-1:100000 IJ SOLN
30.0000 mL | Freq: Once | INTRAMUSCULAR | Status: AC
  Administered 2019-11-08: 30 mL
  Filled 2019-11-08: qty 1

## 2019-11-08 NOTE — ED Provider Notes (Signed)
  Face-to-face evaluation   History: He presents by EMS for evaluation of injury to the left lower leg.  He is apparently intoxicated when he fell, cutting the leg.  Unclear what he got the leg on.  During transport EMS felt that his bleeding could not be controlled with pressure so they put a tourniquet on the distal femur region.  This did decrease the bleeding.  Patient is alert enough to give history and states he does not have headache, or neck pain.  Physical exam: Disheveled elderly male, alert, communicative.  On arrival left lower leg,.  Ischemic with tourniquet on distal femur.  Tourniquet taken down and there was no immediate active bleeding from the wound of his left lower leg, about 10 cm below the knee.  The wound is anterolateral oriented.  Neurovascular intact distally after removal of tourniquet.  I assisted the PA, with wound care to control pulsatile bleeding which appeared venous, by tying off the distal vein laceration, with 2 pursestring sutures using 3 and 4-0 Vicryl.  I then began subcutaneous closure using 3-0 Vicryl, horizontal mattress.  PA will continue closure, and I will directly supervise.  Medical screening examination/treatment/procedure(s) were conducted as a shared visit with non-physician practitioner(s) and myself.  I personally evaluated the patient during the encounter    Daleen Bo, MD 11/10/19 2102

## 2019-11-08 NOTE — ED Provider Notes (Signed)
[facility] EMERGENC[facility] DEPARTMENT Provider Note   CSN: [id number] Arrival date & time: 11/08/19  1657     History Chief Complaint  Patient presents with  . Leg Laceration    Johnny Newton is a 64 y.o. male.  HPI Patient is a 64 year old male with a history of alcoholism who had several drinks of alcohol and walked into his shed where he fell cutting his leg on either a piece of broken ceramic, a piece of wood or metal stool.  He was found by EMS with a small puddle of blood around his left leg.  He was actively bleeding steadily but slowly.  A tourniquet was placed on the distal femur to prevent blood loss.  Patient states that he has had several drinks somewhere between 3-5.  Patient denies any headache, neck pain, wrists, elbow, shoulder, back knee hip abdomen and chest pain.  He denies any nausea or vomiting.  He states he did not lose consciousness when he fell and states that it was mechanical fall.   Patient has not taken any medications prior to arrival he received 500 mL of normal saline by EMS.  Tourniquet was placed at 1640 approximately 20 minutes before my assessment in the emergency department.  Patient states that he has severe pain in his left leg which she states began once the tourniquet was applied.  He states he has had sensation in his leg the whole time.     Past Medical History:  Diagnosis Date  . Depression   . Hypertension     Patient Active Problem List   Diagnosis Date Noted  . Alcohol use disorder, severe, dependence ([place]) 09/29/2016  . Cannabis use disorder, severe, dependence ([place]) 09/29/2016  . Major depressive disorder, recurrent episode with anxious distress ([place]) 09/29/2016  . Toxic encephalopathy 09/24/2016  . Acute encephalopathy 09/23/2016  . Suicidal ideation 09/23/2016  . Rhabdomyolysis 09/23/2016  . Normochromic normocytic anemia 09/23/2016  . ETOH abuse 09/23/2016  . Depression 09/23/2016  . Alcohol dependence  with alcohol-induced mood disorder ([place]) 09/23/2016  . Dehydration     History reviewed. No pertinent surgical history.     Family History  Family history unknown: Yes    Social History   Tobacco Use  . Smoking status: Never Smoker  . Smokeless tobacco: Never Used  Vaping Use  . Vaping Use: Never used  Substance Use Topics  . Alcohol use: Yes    Alcohol/week: 12.0 - 19.0 standard drinks    Types: 5 - 7 Glasses of wine, 7 - 12 Cans of beer per week    Comment: per family 'he's an alcoholic'  . Drug use: No    Home Medications Prior to Admission medications   Medication Sig Start Date End Date Taking? Authorizing Provider  calcium carbonate (TUMS - DOSED IN MG ELEMENTAL CALCIUM) 500 MG chewable tablet Chew 1 tablet (200 mg of elemental calcium total) by mouth 3 (three) times daily as needed for indigestion or heartburn. Patient not taking: Reported on 11/10/2019 09/29/16   Lindell Spar I, NP  cephALEXin (KEFLEX) 500 MG capsule Take 1 capsule (500 mg total) by mouth 3 (three) times daily for 5 days. 11/08/19 11/13/19  Tedd Sias, PA  hydrOXYzine (ATARAX/VISTARIL) 25 MG tablet Take 1 tablet (25 mg total) by mouth 3 (three) times daily as needed for anxiety. Patient not taking: Reported on 11/10/2019 09/29/16   Lindell Spar I, NP  metoprolol succinate (TOPROL-XL) 50 MG 24 hr tablet Take 1 tablet (  50 mg total) by mouth daily. For high blood pressure 09/29/16   Nwoko, Herbert Pun I, NP  naltrexone (DEPADE) 50 MG tablet Take 1 tablet (50 mg total) by mouth daily. For alcoholism Patient not taking: Reported on 11/10/2019 09/30/16   Lindell Spar I, NP  polyvinyl alcohol (LIQUIFILM TEARS) 1.4 % ophthalmic solution Place 1 drop into both eyes as needed for dry eyes.    [provider]  sertraline (ZOLOFT) 100 MG tablet Take 100 mg by mouth daily. 10/06/19   [provider]  sertraline (ZOLOFT) 50 MG tablet Take 3 tablets (150 mg total) by mouth daily. For depression Patient not  taking: Reported on 11/10/2019 09/30/16   Lindell Spar I, NP  traZODone (DESYREL) 50 MG tablet Take 1 tablet (50 mg total) by mouth at bedtime. For insomnia Patient not taking: Reported on 11/10/2019 09/29/16   Lindell Spar I, NP    Allergies    Patient has no known allergies.  Review of Systems   Review of Systems  Constitutional: Negative for chills and fever.  HENT: Negative for congestion.   Eyes: Negative for pain.  Respiratory: Negative for cough and shortness of breath.   Cardiovascular: Negative for chest pain and leg swelling.  Gastrointestinal: Negative for abdominal pain, diarrhea, nausea and vomiting.  Genitourinary: Negative for dysuria.  Musculoskeletal: Negative for myalgias.  Skin: Positive for wound. Negative for rash.  Neurological: Negative for dizziness and headaches.  Psychiatric/Behavioral: Negative for hallucinations, self-injury and suicidal ideas.    Physical Exam Updated Vital Signs BP 119/68   Pulse 79   Temp 98.3 F (36.8 C) (Oral)   Resp (!) 27   Ht 5\' 7"  (1.702 m)   Wt 72.6 kg   SpO2 100%   BMI 25.06 kg/m   Physical Exam Vitals and nursing note reviewed.  Constitutional:      General: He is not in acute distress.    Comments: Patient is 64 year old male appears a somewhat older than stated age.  Is pleasant and cooperative appears somewhat intoxicated.  Disheveled with blood splattered on shirt and pants.  Tourniquet placed on proximal thigh of the left leg.  There is light bleeding present with tourniquet in place  HENT:     Head: Normocephalic and atraumatic.     Comments: No hematomas or contusions or tenderness of the cranium.    Nose: Nose normal.     Mouth/Throat:     Mouth: Mucous membranes are moist.  Eyes:     General: No scleral icterus.    Extraocular Movements: Extraocular movements intact.     Pupils: Pupils are equal, round, and reactive to light.  Cardiovascular:     Rate and Rhythm: Normal rate and regular rhythm.      Pulses: Normal pulses.     Heart sounds: Normal heart sounds.     Comments: No tachycardia.  Bilateral DP and PT pulses are symmetric once tourniquet removed. Pulmonary:     Effort: Pulmonary effort is normal. No respiratory distress.     Breath sounds: No wheezing.  Abdominal:     Palpations: Abdomen is soft.     Tenderness: There is no abdominal tenderness. There is no guarding or rebound.  Musculoskeletal:     Cervical back: Normal range of motion.     Right lower leg: No edema.     Left lower leg: No edema.  Skin:    General: Skin is warm and dry.     Capillary Refill: Capillary refill takes less  than 2 seconds.     Comments: 13 cm long linear laceration with 1 cm tributary creating a small triangular flap. Laceration is located approximately 10 cm below the proximal tibia.  It is wrapping across the entirety of his anterior shin and around both the proximal lateral aspect.  Area is gaping and there is muscle tissue and deep dermis exposed.  Significant bleeding present initially.    Neurological:     Mental Status: He is alert. Mental status is at baseline.     Comments: Sensation intact bilateral lower extremities  Psychiatric:        Mood and Affect: Mood normal.        Behavior: Behavior normal.     ED Results / Procedures / Treatments   Labs (all labs ordered are listed, but only abnormal results are displayed) Labs Reviewed  CBC WITH DIFFERENTIAL/PLATELET - Abnormal; Notable for the following components:      Result Value   RBC 3.80 (*)    Hemoglobin 11.8 (*)    HCT 36.9 (*)    RDW 15.8 (*)    All other components within normal limits  COMPREHENSIVE METABOLIC PANEL - Abnormal; Notable for the following components:   Potassium 3.4 (*)    CO2 18 (*)    Glucose, Bld 100 (*)    AST 294 (*)    ALT 163 (*)    Total Bilirubin 1.3 (*)    Anion gap 21 (*)    All other components within normal limits  APTT  TYPE AND SCREEN  ABO/RH    EKG EKG  Interpretation  Date/Time:  Wednesday November 08 2019 17:08:58 EDT Ventricular Rate:  84 PR Interval:    QRS Duration: 98 QT Interval:  428 QTC Calculation: 506 R Axis:   84 Text Interpretation: Sinus rhythm Borderline right axis deviation Abnormal R-wave progression, early transition Prolonged QT interval No old tracing to compare Confirmed by Daleen Bo [phone]) on 11/08/2019 6:21:50 PM Also confirmed by Daleen Bo [phone]), editor Victory Dakin [phone])  on 11/09/2019 9:09:39 AM   Radiology DG Tibia/Fibula Left  Result Date: 11/08/2019 CLINICAL DATA:  Laceration, leg pain EXAM: LEFT TIBIA AND FIBULA - 2 VIEW COMPARISON:  None. FINDINGS: There is no evidence of fracture or other focal bone lesions. Soft tissues are unremarkable. IMPRESSION: Negative. Electronically Signed   By: Fidela Salisbury MD   On: 11/08/2019 18:26    Procedures .Marland KitchenLaceration Repair  Date/Time: 11/10/2019 12:41 PM Performed by: Tedd Sias, PA Authorized by: Tedd Sias, PA   Consent:    Consent obtained:  Verbal   Consent given by:  Patient   Risks discussed:  Infection, need for additional repair, pain, poor cosmetic result and poor wound healing   Alternatives discussed:  No treatment and delayed treatment Universal protocol:    Procedure explained and questions answered to patient or proxy's satisfaction: yes     Relevant documents present and verified: yes     Test results available and properly labeled: yes     Imaging studies available: yes     Required blood products, implants, devices, and special equipment available: yes     Site/side marked: yes     Immediately prior to procedure, a time out was called: yes     Patient identity confirmed:  Verbally with patient Anesthesia (see MAR for exact dosages):    Anesthesia method:  Local infiltration   Local anesthetic:  Lidocaine 2% WITH epi Laceration details:    Location:  Leg   Leg location:  L lower leg   Length (cm):  13   Depth (mm):   2 Repair type:    Repair type:  Complex Pre-procedure details:    Preparation:  Patient was prepped and draped in usual sterile fashion and imaging obtained to evaluate for foreign bodies Exploration:    Hemostasis achieved with:  Direct pressure, epinephrine and tourniquet (purse string sutures)   Wound exploration: wound explored through full range of motion and entire depth of wound probed and visualized     Wound extent: no foreign bodies/material noted and no tendon damage noted     Contaminated: no   Treatment:    Area cleansed with:  Saline and Betadine   Amount of cleaning:  Extensive   Irrigation solution:  Sterile saline   Irrigation volume:  500 cc   Irrigation method:  Pressure wash   Visualized foreign bodies/material removed: no     Debridement:  None Subcutaneous repair:    Suture size:  3-0   Suture material:  Vicryl   Suture technique:  Simple interrupted (Deep dermal stiches)   Number of sutures:  6 Skin repair:    Repair method:  Sutures and staples   Suture size:  3-0   Suture material:  Prolene   Suture technique: 1 corner stitch at Y shaped tributary./flap + 1 simple interrupted.   Number of sutures:  2   Number of staples:  16 Approximation:    Approximation:  Close Post-procedure details:    Dressing:  Antibiotic ointment, non-adherent dressing and bulky dressing (ACE wrap over top)   Patient tolerance of procedure:  Tolerated well, no immediate complications .Marland KitchenLaceration Repair  Date/Time: 11/10/2019 12:49 PM Performed by: Tedd Sias, PA Authorized by: Tedd Sias, PA   Consent:    Consent obtained:  Verbal   Consent given by:  Patient   Risks discussed:  Infection, need for additional repair, pain, poor cosmetic result and poor wound healing   Alternatives discussed:  No treatment and delayed treatment Universal protocol:    Procedure explained and questions answered to patient or proxy's satisfaction: yes     Relevant documents  present and verified: yes     Test results available and properly labeled: yes     Imaging studies available: yes     Required blood products, implants, devices, and special equipment available: yes     Site/side marked: yes     Immediately prior to procedure, a time out was called: yes     Patient identity confirmed:  Verbally with patient Anesthesia (see MAR for exact dosages):    Anesthesia method:  Local infiltration Laceration details:    Location:  Leg   Leg location:  L lower leg   Length (cm):  13   Depth (mm):  2 Repair type:    Repair type:  Complex Exploration:    Hemostasis achieved with:  Direct pressure, epinephrine and tied off vessels   Wound extent: no foreign bodies/material noted and no tendon damage noted     Contaminated: no   Treatment:    Area cleansed with:  Saline   Amount of cleaning:  Standard   Irrigation solution:  Sterile saline (see wound skin repair for full details)   Irrigation method:  Pressure wash   Visualized foreign bodies/material removed: no   Skin repair:    Repair method:  Sutures   Suture size:  3-0   Wound skin closure material used: vicryl.  Suture technique: purse string.   Number of sutures:  2 Approximation:    Approximation:  Close Post-procedure details:    Dressing:  Antibiotic ointment and non-adherent dressing   Patient tolerance of procedure:  Tolerated well, no immediate complications Comments:     Pressing sutures were placed with the supervision of my attending physician Dr. Eulis Foster   (including critical care time)  Medications Ordered in ED Medications  sodium chloride 0.9 % bolus 1,000 mL (0 mLs Intravenous Stopped 11/08/19 1835)  lidocaine-EPINEPHrine (XYLOCAINE W/EPI) 1 %-1:100000 (with pres) injection 30 mL (30 mLs Infiltration Given 11/08/19 1725)    ED Course  I have reviewed the triage vital signs and the nursing notes.  Pertinent labs & imaging results that were available during my care of the patient  were reviewed by me and considered in my medical decision making (see chart for details).  Patient is inebriated 64 year old on exam or fall in your after 2-3 drinks with deep breathing laceration is left lower leg.  This required extensive efforts to achieve hemostasis including injection of lidocaine with epi, direct pressure for 30 minutes, tourniquet and it 2 pursestring sutures placed by my attending physician to control bleeding.  Pressure held by EMT and myself for 30 minutes with saline soaked gauze.  Bleeding improved but was found to have slow pulsatile bleeding that was halted with 2 pressuring sutures placed by my attending physician.  Clinical Course as of Nov 10 1246  Wed Nov 08, 2019  1903 Notable for AST greater than ALT roughly 2-1 fashion.  This is consistent with heavy alcohol use which is consistent with patient's history.  Anion gap likely secondary to alcoholic ketosis.  CBC without leukocytosis or significant anemia.  Patient has lost some blood but has vital signs within normal limits.  He is mentating well able answer questions appropriately follow commands.  Blood pressure is within normal limits and is not tachycardic.  He does not appear anemic.  Coags within normal notes.  Comprehensive metabolic panel(!) [WF]  1610 Patient has received 1 L normal saline.  Will be placed in a large bulky dressing with Ace wrap compression.  Patient seems only mildly intoxicated.  Will metabolize to freedom.   [WF]  1907 I discussed this case with my attending physician who cosigned this note including patient's presenting symptoms, physical exam, and planned diagnostics and interventions. Attending physician stated agreement with plan or made changes to plan which were implemented.   Attending physician assessed patient at bedside.   [WF]  2037 Reviewed x-ray of left tib-fib which is without any fracture.  Or any foreign body.  Agree of radiology read.   [WF]    Clinical Course User  Index [WF] Tedd Sias, [place]   MDM Rules/Calculators/A&P                           Patient received 1 L normal saline.  Uncertain how much blood was loss per EMS likely less than 500 mL and likely less than that.  Given that the tourniquet was placed relatively early.  I have low suspicion that this patient is in need of blood transfusion.  Patient has mild stable chronic anemia and although acute bleeding does not really change the hemoglobin he is not orthostatic lightheaded or short of breath.  I recommended close follow-up with PCP for recheck of his blood work to make sure that he is not severely anemic.  Mild hypokalemia  without symptoms.  AST approximately 2 times greater than ALT consistent as alcohol use.  He has no abdominal pain mild anion gap likely secondary to alcoholic ketosis.  I discussed the results of my seeing physician.  Plan is to discharge when clinically sober.  Patient ambulated without any disequilibrium.  He is clinically sober at this point has been observed for 5 hours.  Is requesting to go home.  His sister is requesting admission because he "acts crazy when he drinks "and she is worried that he is at risk for further injuries.  He states he would prefer to go home at this time.  He is sober and able to make his own decisions.  He denies SI, HI, AVH.  Is stable on his feet and I believe he is stable for discharge as long as he is provided a ride home.  Patient is agreeable to taking a cab home.  He was ambulated out to cab by RN and given return precautions.  I discussed this case with my attending physician who cosigned this note including patient's presenting symptoms, physical exam, and planned diagnostics and interventions. Attending physician stated agreement with plan or made changes to plan which were implemented.   Attending physician assessed patient at bedside.  Patient agreeable to plan discharging in CAD.  Will follow up with PCP for wound recheck in the  next 3-4 days.  Will have staples removed in 10-12 days.  Start with keflex.  Given wound care information and return precautions  Final Clinical Impression(s) / ED Diagnoses Final diagnoses:  Laceration of left lower extremity, initial encounter    Rx / DC Orders ED Discharge Orders         Ordered    cephALEXin (KEFLEX) 500 MG capsule  3 times daily     Discontinue  Reprint     11/08/19 2048           Tedd Sias, PA 11/10/19 1256    Daleen Bo, MD 11/10/19 2102

## 2019-11-08 NOTE — ED Notes (Signed)
Pt leg being sutured at this time.  Pt's sister at bedside

## 2019-11-08 NOTE — ED Triage Notes (Signed)
BIB [facility] after reported falling at home in his garage.   Pt has large laceration to left lower leg.  Tourniquet placed by EMS at 16:40. Pt admits to 2 shots of vodka today  Pt denies hitting his head and denies neck pain.  Pt not taking blood thinners.

## 2019-11-08 NOTE — Discharge Instructions (Addendum)
Keep entire bandage on your leg for the next 24 hours at least.  You can elevate your leg.  Do not drink any alcohol.  Do not exercise excessively.  You may do gentle walking.  Drink plenty of water and follow-up with your primary care doctor's office early next week to have your laceration evaluated.  Please take the antibiotics I prescribed you as I have prescribed the next 5 days.  Drink plenty of water. You may always return to the ED at any time for any new or concerning symptoms.

## 2019-11-08 NOTE — ED Notes (Signed)
Cab called to transport pt home.

## 2019-11-09 ENCOUNTER — Emergency Department (HOSPITAL_COMMUNITY)
Admission: EM | Admit: 2019-11-09 | Discharge: 2019-11-10 | Disposition: A | Discharge status: Home / Self Care | Payer: BC Managed Care – PPO | Attending: Emergency Medicine | Admitting: Emergency Medicine

## 2019-11-09 ENCOUNTER — Encounter (HOSPITAL_COMMUNITY): Payer: Self-pay | Admitting: Emergency Medicine

## 2019-11-09 ENCOUNTER — Other Ambulatory Visit: Payer: Self-pay

## 2019-11-09 DIAGNOSIS — F332 Major depressive disorder, recurrent severe without psychotic features: Secondary | ICD-10-CM | POA: Insufficient documentation

## 2019-11-09 DIAGNOSIS — Z046 Encounter for general psychiatric examination, requested by authority: Secondary | ICD-10-CM

## 2019-11-09 DIAGNOSIS — Z20822 Contact with and (suspected) exposure to covid-19: Secondary | ICD-10-CM | POA: Insufficient documentation

## 2019-11-09 DIAGNOSIS — I10 Essential (primary) hypertension: Secondary | ICD-10-CM | POA: Diagnosis not present

## 2019-11-09 DIAGNOSIS — R45851 Suicidal ideations: Secondary | ICD-10-CM | POA: Diagnosis not present

## 2019-11-09 DIAGNOSIS — F1010 Alcohol abuse, uncomplicated: Secondary | ICD-10-CM

## 2019-11-09 DIAGNOSIS — R259 Unspecified abnormal involuntary movements: Secondary | ICD-10-CM | POA: Diagnosis not present

## 2019-11-09 DIAGNOSIS — F10229 Alcohol dependence with intoxication, unspecified: Secondary | ICD-10-CM | POA: Diagnosis not present

## 2019-11-09 DIAGNOSIS — Z79899 Other long term (current) drug therapy: Secondary | ICD-10-CM | POA: Diagnosis not present

## 2019-11-09 LAB — CBC WITH DIFFERENTIAL/PLATELET
Abs Immature Granulocytes: 0.05 10*3/uL (ref 0.00–0.07)
Basophils Absolute: 0.1 10*3/uL (ref 0.0–0.1)
Basophils Relative: 1 %
Eosinophils Absolute: 0 10*3/uL (ref 0.0–0.5)
Eosinophils Relative: 0 %
HCT: 31.4 % — ABNORMAL LOW (ref 39.0–52.0)
Hemoglobin: 10.5 g/dL — ABNORMAL LOW (ref 13.0–17.0)
Immature Granulocytes: 1 %
Lymphocytes Relative: 27 %
Lymphs Abs: 2.4 10*3/uL (ref 0.7–4.0)
MCH: 32.6 pg (ref 26.0–34.0)
MCHC: 33.4 g/dL (ref 30.0–36.0)
MCV: 97.5 fL (ref 80.0–100.0)
Monocytes Absolute: 0.9 10*3/uL (ref 0.1–1.0)
Monocytes Relative: 10 %
Neutro Abs: 5.5 10*3/uL (ref 1.7–7.7)
Neutrophils Relative %: 61 %
Platelets: 202 10*3/uL (ref 150–400)
RBC: 3.22 MIL/uL — ABNORMAL LOW (ref 4.22–5.81)
RDW: 15.6 % — ABNORMAL HIGH (ref 11.5–15.5)
WBC: 9 10*3/uL (ref 4.0–10.5)
nRBC: 0 % (ref 0.0–0.2)

## 2019-11-09 LAB — COMPREHENSIVE METABOLIC PANEL
ALT: 135 U/L — ABNORMAL HIGH (ref 0–44)
AST: 199 U/L — ABNORMAL HIGH (ref 15–41)
Albumin: 4.4 g/dL (ref 3.5–5.0)
Alkaline Phosphatase: 64 U/L (ref 38–126)
Anion gap: 19 — ABNORMAL HIGH (ref 5–15)
BUN: 10 mg/dL (ref 8–23)
CO2: 22 mmol/L (ref 22–32)
Calcium: 9.8 mg/dL (ref 8.9–10.3)
Chloride: 101 mmol/L (ref 98–111)
Creatinine, Ser: 0.64 mg/dL (ref 0.61–1.24)
GFR calc Af Amer: >60 mL/min (ref >60)
GFR calc non Af Amer: >60 mL/min (ref >60)
Glucose, Bld: 109 mg/dL — ABNORMAL HIGH (ref 70–99)
Potassium: 3.4 mmol/L — ABNORMAL LOW (ref 3.5–5.1)
Sodium: 142 mmol/L (ref 135–145)
Total Bilirubin: 1.1 mg/dL (ref 0.3–1.2)
Total Protein: 7.5 g/dL (ref 6.5–8.1)

## 2019-11-09 LAB — ETHANOL: Alcohol, Ethyl (B): 245 mg/dL — ABNORMAL HIGH (ref <10)

## 2019-11-09 LAB — SALICYLATE LEVEL: Salicylate Lvl: <7 mg/dL — ABNORMAL LOW (ref 7.0–30.0)

## 2019-11-09 LAB — SARS CORONAVIRUS 2 BY RT PCR (HOSPITAL ORDER, PERFORMED IN ~~LOC~~ HOSPITAL LAB): SARS Coronavirus 2: NEGATIVE

## 2019-11-09 LAB — ACETAMINOPHEN LEVEL: Acetaminophen (Tylenol), Serum: <10 ug/mL — ABNORMAL LOW (ref 10–30)

## 2019-11-09 MED ORDER — ACETAMINOPHEN 325 MG PO TABS: 650.0000 mg | ORAL_TABLET | ORAL | Status: DC | PRN

## 2019-11-09 MED ORDER — LORAZEPAM 2 MG/ML IJ SOLN: 0.0000 mg | Freq: Two times a day (BID) | INTRAMUSCULAR | Status: DC

## 2019-11-09 MED ORDER — ALUM & MAG HYDROXIDE-SIMETH 200-200-20 MG/5ML PO SUSP: 30.0000 mL | Freq: Four times a day (QID) | ORAL | Status: DC | PRN

## 2019-11-09 MED ORDER — LORAZEPAM 1 MG PO TABS: 0.0000 mg | ORAL_TABLET | Freq: Two times a day (BID) | ORAL | Status: DC

## 2019-11-09 MED ORDER — NICOTINE 21 MG/24HR TD PT24
21.0000 mg | MEDICATED_PATCH | Freq: Every day | TRANSDERMAL | Status: DC
  Filled 2019-11-09: qty 1

## 2019-11-09 MED ORDER — THIAMINE HCL 100 MG/ML IJ SOLN: 100.0000 mg | Freq: Every day | INTRAMUSCULAR | Status: DC

## 2019-11-09 MED ORDER — LORAZEPAM 1 MG PO TABS
0.0000 mg | ORAL_TABLET | Freq: Four times a day (QID) | ORAL | Status: DC
  Administered 2019-11-10: 1 mg via ORAL
  Filled 2019-11-09: qty 1

## 2019-11-09 MED ORDER — THIAMINE HCL 100 MG PO TABS
100.0000 mg | ORAL_TABLET | Freq: Every day | ORAL | Status: DC
  Administered 2019-11-10: 100 mg via ORAL
  Filled 2019-11-09: qty 1

## 2019-11-09 MED ORDER — ZOLPIDEM TARTRATE 5 MG PO TABS: 5.0000 mg | ORAL_TABLET | Freq: Every evening | ORAL | Status: DC | PRN

## 2019-11-09 MED ORDER — LORAZEPAM 2 MG/ML IJ SOLN: 0.0000 mg | Freq: Four times a day (QID) | INTRAMUSCULAR | Status: DC

## 2019-11-09 NOTE — ED Triage Notes (Addendum)
Pt BIB [facility]. Per IVC paperwork, pt has hx if ETOH abuse, depression, anxiety, and cardiac issues. Pts family is unsure of pts compliance with rx'd medications. Reports pt expressed to his family that he wants to kill himself. Pt's wife recently had a stroke and was admitted to [facility] and family states the pt has been withdrawn since then. Pt became intoxicated yesterday and injured his lower left leg. Pt was treated by PCP for this. Pt currently denying SI/HI and states "I am a happy-go-lucky man." Pt also stating he has had a stressful week.

## 2019-11-09 NOTE — ED Provider Notes (Signed)
[facility] DEPT Provider Note   CSN: [id number] Arrival date & time: 11/09/19  2049     History Chief Complaint  Patient presents with  . IVC    Johnny Newton is a 64 y.o. male.  The history is provided by the patient, medical records and the police. No language interpreter was used.     64 year old male significant history of alcohol abuse, polysubstance abuse, depression, suicidal ideation brought here by [employer] with IVC paperwork for suicidal ideation.  Per IVC note, IVC was filed by patient's sister who voiced concern that patient has expressed suicidal ideation as well as concern for being noncompliant with his medication.  IVC paper also expressed with patient's alcohol abuse.  Patient states he does drink alcohol regularly approximately 4 beers a day last drink was today.  He report that he used to go to AA which did help with his alcohol abuse however due to Covid he was no longer able to attend his typical AA group and has had relapse.  He also expressed being overly concerned about the health of his wife who is currently in the ICU for stroke related to new cancer diagnosis.  He did injure his left lower leg yesterday when he accidentally struck it against a sharp object while being intoxicated.  He was seen in the ED and has his wound manage.  He however denies any active SI or HI.  He denies auditory or visual hallucination.  He admits his sleeping habit has been off but he is eating okay.  He denies feeling depressed.  He denies drug use.  He admits that he is has been under some stress with health of his wife.  Patient states he would much prefer to go home and rest because he was recommended to have his leg elevated due to recent injury.  Past Medical History:  Diagnosis Date  . Depression   . Hypertension     Patient Active Problem List   Diagnosis Date Noted  . Alcohol use disorder, severe, dependence ([place]) 09/29/2016  . Cannabis use  disorder, severe, dependence ([place]) 09/29/2016  . Major depressive disorder, recurrent episode with anxious distress ([place]) 09/29/2016  . Toxic encephalopathy 09/24/2016  . Acute encephalopathy 09/23/2016  . Suicidal ideation 09/23/2016  . Rhabdomyolysis 09/23/2016  . Normochromic normocytic anemia 09/23/2016  . ETOH abuse 09/23/2016  . Depression 09/23/2016  . Alcohol dependence with alcohol-induced mood disorder ([place]) 09/23/2016  . Dehydration     History reviewed. No pertinent surgical history.     Family History  Family history unknown: Yes    Social History   Tobacco Use  . Smoking status: Never Smoker  . Smokeless tobacco: Never Used  Vaping Use  . Vaping Use: Never used  Substance Use Topics  . Alcohol use: Yes    Alcohol/week: 12.0 - 19.0 standard drinks    Types: 5 - 7 Glasses of wine, 7 - 12 Cans of beer per week    Comment: per family 'he's an alcoholic'  . Drug use: No    Home Medications Prior to Admission medications   Medication Sig Start Date End Date Taking? Authorizing Provider  calcium carbonate (TUMS - DOSED IN MG ELEMENTAL CALCIUM) 500 MG chewable tablet Chew 1 tablet (200 mg of elemental calcium total) by mouth 3 (three) times daily as needed for indigestion or heartburn. 09/29/16   Lindell Spar I, NP  cephALEXin (KEFLEX) 500 MG capsule Take 1 capsule (500 mg total) by mouth 3 (  three) times daily for 5 days. 11/08/19 11/13/19  Tedd Sias, PA  hydrOXYzine (ATARAX/VISTARIL) 25 MG tablet Take 1 tablet (25 mg total) by mouth 3 (three) times daily as needed for anxiety. 09/29/16   Lindell Spar I, NP  metoprolol succinate (TOPROL-XL) 50 MG 24 hr tablet Take 1 tablet (50 mg total) by mouth daily. For high blood pressure 09/29/16   Nwoko, Herbert Pun I, NP  naltrexone (DEPADE) 50 MG tablet Take 1 tablet (50 mg total) by mouth daily. For alcoholism 09/30/16   Lindell Spar I, NP  sertraline (ZOLOFT) 50 MG tablet Take 3 tablets (150 mg total) by mouth daily. For  depression 09/30/16   Lindell Spar I, NP  traZODone (DESYREL) 50 MG tablet Take 1 tablet (50 mg total) by mouth at bedtime. For insomnia 09/29/16   Encarnacion Slates, NP    Allergies    Patient has no known allergies.  Review of Systems   Review of Systems  All other systems reviewed and are negative.   Physical Exam Updated Vital Signs BP 125/84 (BP Location: Left Arm)   Pulse 94   Temp 98.2 F (36.8 C) (Oral)   Resp 20   Ht 5\' 7"  (1.702 m)   Wt 74.8 kg   SpO2 99%   BMI 25.84 kg/m   Physical Exam Vitals and nursing note reviewed.  Constitutional:      General: He is not in acute distress.    Appearance: He is well-developed.  HENT:     Head: Atraumatic.  Eyes:     Conjunctiva/sclera: Conjunctivae normal.  Cardiovascular:     Rate and Rhythm: Normal rate and regular rhythm.     Pulses: Normal pulses.     Heart sounds: Normal heart sounds.  Pulmonary:     Effort: Pulmonary effort is normal.     Breath sounds: Normal breath sounds.  Abdominal:     Palpations: Abdomen is soft.  Musculoskeletal:     Cervical back: Neck supple.  Skin:    Findings: No rash.  Neurological:     Mental Status: He is alert. Mental status is at baseline.     GCS: GCS eye subscore is 4. GCS verbal subscore is 5. GCS motor subscore is 6.     Comments: Able to ambulate without difficulty.  Psychiatric:        Mood and Affect: Mood normal.        Speech: Speech normal.        Behavior: Behavior is cooperative.        Thought Content: Thought content does not include homicidal or suicidal ideation.     ED Results / Procedures / Treatments   Labs (all labs ordered are listed, but only abnormal results are displayed) Labs Reviewed  COMPREHENSIVE METABOLIC PANEL - Abnormal; Notable for the following components:      Result Value   Potassium 3.4 (*)    Glucose, Bld 109 (*)    AST 199 (*)    ALT 135 (*)    Anion gap 19 (*)    All other components within normal limits  CBC WITH  DIFFERENTIAL/PLATELET - Abnormal; Notable for the following components:   RBC 3.22 (*)    Hemoglobin 10.5 (*)    HCT 31.4 (*)    RDW 15.6 (*)    All other components within normal limits  SARS CORONAVIRUS 2 BY RT PCR ([facility])  ETHANOL  RAPID URINE DRUG SCREEN, HOSP PERFORMED  SALICYLATE LEVEL  ACETAMINOPHEN LEVEL    EKG None  Radiology DG Tibia/Fibula Left  Result Date: 11/08/2019 CLINICAL DATA:  Laceration, leg pain EXAM: LEFT TIBIA AND FIBULA - 2 VIEW COMPARISON:  None. FINDINGS: There is no evidence of fracture or other focal bone lesions. Soft tissues are unremarkable. IMPRESSION: Negative. Electronically Signed   By: Fidela Salisbury MD   On: 11/08/2019 18:26    Procedures Procedures (including critical care time)  Medications Ordered in ED Medications  LORazepam (ATIVAN) injection 0-4 mg (has no administration in time range)    Or  LORazepam (ATIVAN) tablet 0-4 mg (has no administration in time range)  LORazepam (ATIVAN) injection 0-4 mg (has no administration in time range)    Or  LORazepam (ATIVAN) tablet 0-4 mg (has no administration in time range)  thiamine tablet 100 mg (has no administration in time range)    Or  thiamine (B-1) injection 100 mg (has no administration in time range)  acetaminophen (TYLENOL) tablet 650 mg (has no administration in time range)  zolpidem (AMBIEN) tablet 5 mg (has no administration in time range)  alum & mag hydroxide-simeth (MAALOX/MYLANTA) 200-200-20 MG/5ML suspension 30 mL (has no administration in time range)  nicotine (NICODERM CQ - dosed in mg/24 hours) patch 21 mg (has no administration in time range)    ED Course  I have reviewed the triage vital signs and the nursing notes.  Pertinent labs & imaging results that were available during my care of the patient were reviewed by me and considered in my medical decision making (see chart for details).    MDM Rules/Calculators/A&P                           BP 125/84 (BP Location: Left Arm)   Pulse 94   Temp 98.2 F (36.8 C) (Oral)   Resp 20   Ht 5\' 7"  (1.702 m)   Wt 74.8 kg   SpO2 99%   BMI 25.84 kg/m   Final Clinical Impression(s) / ED Diagnoses Final diagnoses:  Alcohol abuse  Involuntary commitment    Rx / DC Orders ED Discharge Orders    None     9:52 PM Patient brought here with IVC paperwork due to family concern of his alcohol abuse and reportedly having suicidal ideation.  Patient however denies having SI or HI.  He admits to alcohol use but states he is attending AA to help with his addiction.  He did endorse increasing stress due to his wife being in the hospital for stroke and new cancer.  Patient is calm and cooperative and pleasant.  Will perform medical screening exam, and will consult TTS for psych eval once patient is medically cleared.  He does have IVC paper.  10:40 PM Pt sign out to oncoming provider who will f/u on labs and assess for medical clearance.  TTS have been consulted. COVID-19 screening test ordered. CIWA protocol   Johnny Newton was evaluated in Emergency Department on 11/09/2019 for the symptoms described in the history of present illness. He was evaluated in the context of the global COVID-19 pandemic, which necessitated consideration that the patient might be at risk for infection with the SARS-CoV-2 virus that causes COVID-19. Institutional protocols and algorithms that pertain to the evaluation of patients at risk for COVID-19 are in a state of rapid change based on information released by regulatory bodies including the CDC and federal and state organizations. These policies and algorithms were  followed during the patient's care in the ED.    Domenic Moras, PA-C 11/09/19 2247    Quintella Reichert, MD 11/10/19 [phone]

## 2019-11-10 DIAGNOSIS — F10229 Alcohol dependence with intoxication, unspecified: Secondary | ICD-10-CM

## 2019-11-10 LAB — RAPID URINE DRUG SCREEN, HOSP PERFORMED
Amphetamines: NOT DETECTED
Barbiturates: NOT DETECTED
Benzodiazepines: NOT DETECTED
Cocaine: NOT DETECTED
Opiates: NOT DETECTED
Tetrahydrocannabinol: POSITIVE — AB

## 2019-11-10 NOTE — ED Provider Notes (Signed)
Patient has been evaluated by TTS who is of the opinion the IVC can be terminated and patient safely discharged.  I have reassessed him and he appears awake, alert, and appropriate.  Patient is not expressing any suicidal or homicidal ideation.  He does admit that he has been consuming more alcohol than he should recently and will make efforts to reduce his alcohol consumption.  He is not having any auditory or visual hallucinations and I feel as though discharge is appropriate.  I am in agreement with terminating the IVC and discharge.  Arrangements made for outpatient follow-up.   Veryl Speak, MD 11/10/19 1215

## 2019-11-10 NOTE — Consult Note (Signed)
Telepsych Consultation   Reason for Consult:  Drinking a lot  Referring Physician:  EDP Location of Patient:  Location of Provider: Other: [place]  Patient Identification: Johnny Newton MRN:  [id number] Principal Diagnosis: <principal problem not specified> Diagnosis:  Active Problems:   * No active hospital problems. *   Total Time spent with patient: 20 minutes  Subjective:   Johnny Newton is a 64 y.o. male patient admitted on an IVC by his sister for safety concerns as patient has been drinking heavily, has been diagnosed with depression, anxiety and heart issues.  HPI: Patient is a 64 year old male who reports that he has had recent multiple stressors which include his wife having a stroke, currently in the hospital because of bladder tumor.  Patient adds that his sisters that live in [place] are supportive, acknowledges that he is relapsed on alcohol, was sober for a year and a half, was not able to attend AA in person , and because of current stressors was unable to cope and relapsed.  Patient adds while he has been in a ED, his sisters have taken the alcohol out of his house, states that Johnny Newton has helped him in the past to stay sober, plans to attend groups regularly.  Patient denies any thoughts of hurting himself or others, denies any history of suicide attempts, suicidal thoughts.  Patient also denies any psychotic symptoms.  Patient however was noted to have tremors due to alcohol withdrawal but patient denies history of DTs or seizures.  Also recommended patient to start CD  IOP program at [facility] outpatient, information to be given to patient at the time of discharge as patient is not sure he wants to at this time  Past Psychiatric History: Patient has history of depression, alcohol use disorder  Risk to Self:  No  Risk to Others:  No Prior Inpatient Therapy:  No Prior Outpatient Therapy:   No Past Medical History:  Past Medical History:  Diagnosis Date  . Depression    . Hypertension    History reviewed. No pertinent surgical history. Family History:  Family History  Family history unknown: Yes   Family Psychiatric  History:unchanged Social History:  Social History   Substance and Sexual Activity  Alcohol Use Yes  . Alcohol/week: 12.0 - 19.0 standard drinks  . Types: 5 - 7 Glasses of wine, 7 - 12 Cans of beer per week   Comment: per family 'he's an alcoholic'     Social History   Substance and Sexual Activity  Drug Use No    Social History   Socioeconomic History  . Marital status: Unknown    Spouse name: Not on file  . Number of children: Not on file  . Years of education: Not on file  . Highest education level: Not on file  Occupational History  . Not on file  Tobacco Use  . Smoking status: Never Smoker  . Smokeless tobacco: Never Used  Vaping Use  . Vaping Use: Never used  Substance and Sexual Activity  . Alcohol use: Yes    Alcohol/week: 12.0 - 19.0 standard drinks    Types: 5 - 7 Glasses of wine, 7 - 12 Cans of beer per week    Comment: per family 'he's an alcoholic'  . Drug use: No  . Sexual activity: Not Currently  Other Topics Concern  . Not on file  Social History Narrative  . Not on file   Social Determinants of Health   Financial Resource Strain:   .  Difficulty of Paying Living Expenses:   Food Insecurity:   . Worried About Charity fundraiser in the Last Year:   . Arboriculturist in the Last Year:   Transportation Needs:   . Film/video editor (Medical):   Marland Kitchen Lack of Transportation (Non-Medical):   Physical Activity:   . Days of Exercise per Week:   . Minutes of Exercise per Session:   Stress:   . Feeling of Stress :   Social Connections:   . Frequency of Communication with Friends and Family:   . Frequency of Social Gatherings with Friends and Family:   . Attends Religious Services:   . Active Member of Clubs or Organizations:   . Attends Archivist Meetings:   Marland Kitchen Marital Status:     Additional Social History:    Allergies:  No Known Allergies  Labs:  Results for orders placed or performed during the hospital encounter of 11/09/19 (from the past 48 hour(s))  SARS Coronavirus 2 by RT PCR (hospital order, performed in [facility] hospital lab) Nasopharyngeal Nasopharyngeal Swab     Status: None   Collection Time: 11/09/19  9:35 PM   Specimen: Nasopharyngeal Swab  Result Value Ref Range   SARS Coronavirus 2 NEGATIVE NEGATIVE    Comment: (NOTE) SARS-CoV-2 target nucleic acids are NOT DETECTED.  The SARS-CoV-2 RNA is generally detectable in upper and lower respiratory specimens during the acute phase of infection. The lowest concentration of SARS-CoV-2 viral copies this assay can detect is 250 copies / mL. A negative result does not preclude SARS-CoV-2 infection and should not be used as the sole basis for treatment or other patient management decisions.  A negative result may occur with improper specimen collection / handling, submission of specimen other than nasopharyngeal swab, presence of viral mutation(s) within the areas targeted by this assay, and inadequate number of viral copies (<250 copies / mL). A negative result must be combined with clinical observations, patient history, and epidemiological information.  Fact Sheet for Patients:   StrictlyIdeas.no  Fact Sheet for Healthcare Providers: BankingDealers.co.za  This test is not yet approved or  cleared by the [place] FDA and has been authorized for detection and/or diagnosis of SARS-CoV-2 by FDA under an Emergency Use Authorization (EUA).  This EUA will remain in effect (meaning this test can be used) for the duration of the COVID-19 declaration under Section 564(b)(1) of the Act, 21 U.S.C. section 360bbb-3(b)(1), unless the authorization is terminated or revoked sooner.  Performed at [facility], [place] [address] [place], [place] [address]   Comprehensive metabolic panel     Status: Abnormal   Collection Time: 11/09/19 10:04 PM  Result Value Ref Range   Sodium 142 135 - 145 mmol/L   Potassium 3.4 (L) 3.5 - 5.1 mmol/L   Chloride 101 98 - 111 mmol/L   CO2 22 22 - 32 mmol/L   Glucose, Bld 109 (H) 70 - 99 mg/dL    Comment: Glucose reference range applies only to samples taken after fasting for at least 8 hours.   BUN 10 8 - 23 mg/dL   Creatinine, Ser 0.64 0.61 - 1.24 mg/dL   Calcium 9.8 8.9 - 10.3 mg/dL   Total Protein 7.5 6.5 - 8.1 g/dL   Albumin 4.4 3.5 - 5.0 g/dL   AST 199 (H) 15 - 41 U/L   ALT 135 (H) 0 - 44 U/L   Alkaline Phosphatase 64 38 - 126 U/L  Total Bilirubin 1.1 0.3 - 1.2 mg/dL   GFR calc non Af Amer >60 >60 mL/min   GFR calc Af Amer >60 >60 mL/min   Anion gap 19 (H) 5 - 15    Comment: Performed at [facility], [place] [address] [place], [place] [address]  Ethanol     Status: Abnormal   Collection Time: 11/09/19 10:04 PM  Result Value Ref Range   Alcohol, Ethyl (B) 245 (H) <10 mg/dL    Comment: (NOTE) Lowest detectable limit for serum alcohol is 10 mg/dL.  For medical purposes only. Performed at [facility], [place] [address], [place], [place] [address]   CBC with Diff     Status: Abnormal   Collection Time: 11/09/19 10:04 PM  Result Value Ref Range   WBC 9.0 4.0 - 10.5 K/uL   RBC 3.22 (L) 4.22 - 5.81 MIL/uL   Hemoglobin 10.5 (L) 13.0 - 17.0 g/dL   HCT 31.4 (L) 39 - 52 %   MCV 97.5 80.0 - 100.0 fL   MCH 32.6 26.0 - 34.0 pg   MCHC 33.4 30.0 - 36.0 g/dL   RDW 15.6 (H) 11.5 - 15.5 %   Platelets 202 150 - 400 K/uL   nRBC 0.0 0.0 - 0.2 %   Neutrophils Relative % 61 %   Neutro Abs 5.5 1.7 - 7.7 K/uL   Lymphocytes Relative 27 %   Lymphs Abs 2.4 0.7 - 4.0 K/uL   Monocytes Relative 10 %   Monocytes Absolute 0.9 0 - 1 K/uL   Eosinophils Relative 0 %   Eosinophils Absolute 0.0 0 - 0 K/uL   Basophils Relative 1 %   Basophils Absolute  0.1 0 - 0 K/uL   Immature Granulocytes 1 %   Abs Immature Granulocytes 0.05 0.00 - 0.07 K/uL    Comment: Performed at [facility], [place] [address] [place], [place] [address]  Salicylate level     Status: Abnormal   Collection Time: 11/09/19 10:04 PM  Result Value Ref Range   Salicylate Lvl <5.6 (L) 7.0 - 30.0 mg/dL    Comment: Performed at [facility], [place] [address] [place], [place] [address]  Acetaminophen level     Status: Abnormal   Collection Time: 11/09/19 10:04 PM  Result Value Ref Range   Acetaminophen (Tylenol), Serum <10 (L) 10 - 30 ug/mL    Comment: (NOTE) Therapeutic concentrations vary significantly. A range of 10-30 ug/mL  may be an effective concentration for many patients. However, some  are best treated at concentrations outside of this range. Acetaminophen concentrations >150 ug/mL at 4 hours after ingestion  and >50 ug/mL at 12 hours after ingestion are often associated with  toxic reactions.  Performed at [facility], [place] [address] [place],  [address]   Urine rapid drug screen (hosp performed)     Status: Abnormal   Collection Time: 11/10/19  6:37 AM  Result Value Ref Range   Opiates NONE DETECTED NONE DETECTED   Cocaine NONE DETECTED NONE DETECTED   Benzodiazepines NONE DETECTED NONE DETECTED   Amphetamines NONE DETECTED NONE DETECTED   Tetrahydrocannabinol POSITIVE (A) NONE DETECTED   Barbiturates NONE DETECTED NONE DETECTED    Comment: (NOTE) DRUG SCREEN FOR MEDICAL PURPOSES ONLY.  IF CONFIRMATION IS NEEDED FOR ANY PURPOSE, NOTIFY LAB WITHIN 5 DAYS.  LOWEST DETECTABLE LIMITS FOR URINE DRUG SCREEN Drug Class  Cutoff (ng/mL) Amphetamine and metabolites    1000 Barbiturate and metabolites    200 Benzodiazepine                 810 Tricyclics and metabolites     300 Opiates and metabolites        300 Cocaine and metabolites        300 THC                             50 Performed at [facility], [place] [address] [place], [place] [address]     Medications:  Current Facility-Administered Medications  Medication Dose Route Frequency Provider Last Rate Last Admin  . acetaminophen (TYLENOL) tablet 650 mg  650 mg Oral Q4H PRN Domenic Moras, PA-C      . alum & mag hydroxide-simeth (MAALOX/MYLANTA) 200-200-20 MG/5ML suspension 30 mL  30 mL Oral Q6H PRN Domenic Moras, PA-C      . LORazepam (ATIVAN) injection 0-4 mg  0-4 mg Intravenous Q6H Domenic Moras, PA-C       Or  . LORazepam (ATIVAN) tablet 0-4 mg  0-4 mg Oral Q6H Domenic Moras, PA-C      . [START ON 11/12/2019] LORazepam (ATIVAN) injection 0-4 mg  0-4 mg Intravenous Q12H Domenic Moras, PA-C       Or  . Derrill Memo ON 11/12/2019] LORazepam (ATIVAN) tablet 0-4 mg  0-4 mg Oral Q12H Domenic Moras, PA-C      . nicotine (NICODERM CQ - dosed in mg/24 hours) patch 21 mg  21 mg Transdermal Daily Domenic Moras, PA-C      . thiamine tablet 100 mg  100 mg Oral Daily Domenic Moras, PA-C       Or  . thiamine (B-1) injection 100 mg  100 mg Intravenous Daily Domenic Moras, PA-C      . zolpidem (AMBIEN) tablet 5 mg  5 mg Oral QHS PRN Domenic Moras, PA-C       Current Outpatient Medications  Medication Sig Dispense Refill  . cephALEXin (KEFLEX) 500 MG capsule Take 1 capsule (500 mg total) by mouth 3 (three) times daily for 5 days. 15 capsule 0  . metoprolol succinate (TOPROL-XL) 50 MG 24 hr tablet Take 1 tablet (50 mg total) by mouth daily. For high blood pressure  0  . polyvinyl alcohol (LIQUIFILM TEARS) 1.4 % ophthalmic solution Place 1 drop into both eyes as needed for dry eyes.    Marland Kitchen sertraline (ZOLOFT) 100 MG tablet Take 100 mg by mouth daily.    . calcium carbonate (TUMS - DOSED IN MG ELEMENTAL CALCIUM) 500 MG chewable tablet Chew 1 tablet (200 mg of elemental calcium total) by mouth 3 (three) times daily as needed for indigestion or heartburn. (Patient not taking: Reported on 11/10/2019)    . hydrOXYzine (ATARAX/VISTARIL)  25 MG tablet Take 1 tablet (25 mg total) by mouth 3 (three) times daily as needed for anxiety. (Patient not taking: Reported on 11/10/2019) 60 tablet 0  . naltrexone (DEPADE) 50 MG tablet Take 1 tablet (50 mg total) by mouth daily. For alcoholism (Patient not taking: Reported on 11/10/2019) 30 tablet 0  . sertraline (ZOLOFT) 50 MG tablet Take 3 tablets (150 mg total) by mouth daily. For depression (Patient not taking: Reported on 11/10/2019) 90 tablet 0  . traZODone (DESYREL) 50 MG tablet Take 1 tablet (50 mg total) by mouth at bedtime. For insomnia (Patient not taking: Reported on 11/10/2019) 30 tablet  0    Musculoskeletal: Strength & Muscle Tone: within normal limits Gait & Station: normal Patient leans: N/A  Psychiatric Specialty Exam: Physical Exam  Review of Systems  Blood pressure (!) 145/84, pulse 74, temperature 98.2 F (36.8 C), temperature source Oral, resp. rate 16, height 5\' 7"  (1.702 m), weight 74.8 kg, SpO2 99 %.Body mass index is 25.84 kg/m.  General Appearance: Casual  Eye Contact:  Good  Speech:  Clear and Coherent and Normal Rate  Volume:  Normal  Mood:  Euthymic  Affect:  Congruent and Full Range  Thought Process:  Coherent, Goal Directed and Descriptions of Associations: Intact  Orientation:  Full (Time, Place, and Person)  Thought Content:  WDL  Suicidal Thoughts:  No  Homicidal Thoughts:  No  Memory:  Immediate;   Fair Recent;   Fair Remote;   Fair  Judgement:  Impaired  Insight:  Present  Psychomotor Activity:  Normal  Concentration:  Concentration: Fair and Attention Span: Fair  Recall:  [employer] of Knowledge:  Fair  Language:  Fair  Akathisia:  No  Handed:  Right  AIMS (if indicated):     Assets:  Desire for Improvement Physical Health Social Support Transportation  ADL's:  Impaired  Cognition:  WNL  Sleep:        Treatment Plan Summary: Plan Patient can be discharge with outpatient substance resources. Patient does attend AA  Disposition:  No evidence of imminent risk to self or others at present.   Patient does not meet criteria for psychiatric inpatient admission. Supportive therapy provided about ongoing stressors. Refer to IOP. Discussed crisis plan, support from social network, calling 911, coming to the Emergency Department, and calling Suicide Hotline.  This service was provided via telemedicine using a 2-way, interactive audio and video technology.  Names of all persons participating in this telemedicine service and their role in this encounter. Name: Hampton Abbot M.D    Hampton Abbot, MD 11/10/2019 10:43 AM

## 2019-11-10 NOTE — ED Provider Notes (Signed)
  TTS evaluated, not sure that he meets criteria for IP treatment at this time based on information he provided during assessment. Unable to get collateral information for petitioner of IVC so will observe overnight and have the psychiatrist re-evaluate in the morning.   Larene Pickett, PA-C 11/10/19 [place], April, MD 11/10/19 [phone]
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
## 2019-11-10 NOTE — BH Assessment (Addendum)
[facility] Assessment Progress Note  Per Burman Foster, this pt does not require psychiatric hospitalization at this time.  Pt presents under IVC initiated by pt's sister, which has been rescinded by EDP Veryl Speak, MD.  Pt is to be discharged from [facility] with recommendation to follow up with a Chemical Dependency Intensive Outpatient Program.  This writer spoke to pt about this option, and he is unable to commit to a program at this time.  Discharge instructions include contact information for the [facility] and for the [facility], both of which offer this program.  Pt's nurse has been notified.  Jalene Mullet, [place] Triage Specialist [phone]

## 2019-11-10 NOTE — Discharge Instructions (Signed)
To help you maintain a sober lifestyle, a substance abuse treatment program may be beneficial to you.  The providers listed below offer Chemical Dependency Intensive Outpatient Programs.  These programs meet several hours a day, several days a week.  Contact them at your earliest opportunity to schedule an intake appointment:       [facility]. [address]. [place], [place] [address]      [phone]      Contact person: Micheline Chapman, LCSW       [facility]      [address] [place], [place] [address]      [phone]

## 2019-11-13 DIAGNOSIS — R634 Abnormal weight loss: Secondary | ICD-10-CM | POA: Diagnosis not present

## 2019-11-13 DIAGNOSIS — F1010 Alcohol abuse, uncomplicated: Secondary | ICD-10-CM | POA: Diagnosis not present

## 2019-11-13 DIAGNOSIS — R030 Elevated blood-pressure reading, without diagnosis of hypertension: Secondary | ICD-10-CM | POA: Diagnosis not present

## 2019-11-13 DIAGNOSIS — S81812D Laceration without foreign body, left lower leg, subsequent encounter: Secondary | ICD-10-CM | POA: Diagnosis not present

## 2019-11-28 ENCOUNTER — Encounter (HOSPITAL_COMMUNITY): Payer: Self-pay | Admitting: Registered Nurse

## 2019-11-28 ENCOUNTER — Emergency Department (HOSPITAL_COMMUNITY)
Admission: EM | Admit: 2019-11-28 | Discharge: 2019-11-28 | Disposition: A | Discharge status: Other Acute Inpatient Hospital | Payer: BC Managed Care – PPO | Source: Home / Self Care | Attending: Emergency Medicine | Admitting: Emergency Medicine

## 2019-11-28 ENCOUNTER — Inpatient Hospital Stay (HOSPITAL_COMMUNITY)
Admission: AD | Admit: 2019-11-28 | Discharge: 2019-12-01 | DRG: 885 | Disposition: A | Discharge status: Home / Self Care | Payer: BC Managed Care – PPO | Attending: Psychiatry | Admitting: Psychiatry

## 2019-11-28 ENCOUNTER — Other Ambulatory Visit: Payer: Self-pay | Admitting: Registered Nurse

## 2019-11-28 ENCOUNTER — Other Ambulatory Visit: Payer: Self-pay

## 2019-11-28 ENCOUNTER — Encounter (HOSPITAL_COMMUNITY): Payer: Self-pay | Admitting: Emergency Medicine

## 2019-11-28 DIAGNOSIS — Z20822 Contact with and (suspected) exposure to covid-19: Secondary | ICD-10-CM | POA: Diagnosis present

## 2019-11-28 DIAGNOSIS — I10 Essential (primary) hypertension: Secondary | ICD-10-CM | POA: Diagnosis not present

## 2019-11-28 DIAGNOSIS — Z79899 Other long term (current) drug therapy: Secondary | ICD-10-CM

## 2019-11-28 DIAGNOSIS — F1020 Alcohol dependence, uncomplicated: Secondary | ICD-10-CM | POA: Insufficient documentation

## 2019-11-28 DIAGNOSIS — F332 Major depressive disorder, recurrent severe without psychotic features: Secondary | ICD-10-CM | POA: Insufficient documentation

## 2019-11-28 DIAGNOSIS — F322 Major depressive disorder, single episode, severe without psychotic features: Principal | ICD-10-CM | POA: Diagnosis present

## 2019-11-28 DIAGNOSIS — F1024 Alcohol dependence with alcohol-induced mood disorder: Secondary | ICD-10-CM | POA: Diagnosis not present

## 2019-11-28 DIAGNOSIS — R456 Violent behavior: Secondary | ICD-10-CM | POA: Insufficient documentation

## 2019-11-28 LAB — CBC
HCT: 37.8 % — ABNORMAL LOW (ref 39.0–52.0)
Hemoglobin: 12.2 g/dL — ABNORMAL LOW (ref 13.0–17.0)
MCH: 32.9 pg (ref 26.0–34.0)
MCHC: 32.3 g/dL (ref 30.0–36.0)
MCV: 101.9 fL — ABNORMAL HIGH (ref 80.0–100.0)
Platelets: 337 10*3/uL (ref 150–400)
RBC: 3.71 MIL/uL — ABNORMAL LOW (ref 4.22–5.81)
RDW: 16.8 % — ABNORMAL HIGH (ref 11.5–15.5)
WBC: 5.3 10*3/uL (ref 4.0–10.5)
nRBC: 0 % (ref 0.0–0.2)

## 2019-11-28 LAB — RAPID URINE DRUG SCREEN, HOSP PERFORMED
Amphetamines: NOT DETECTED
Barbiturates: NOT DETECTED
Benzodiazepines: NOT DETECTED
Cocaine: NOT DETECTED
Opiates: NOT DETECTED
Tetrahydrocannabinol: POSITIVE — AB

## 2019-11-28 LAB — COMPREHENSIVE METABOLIC PANEL
ALT: 128 U/L — ABNORMAL HIGH (ref 0–44)
AST: 167 U/L — ABNORMAL HIGH (ref 15–41)
Albumin: 4.5 g/dL (ref 3.5–5.0)
Alkaline Phosphatase: 78 U/L (ref 38–126)
Anion gap: 17 — ABNORMAL HIGH (ref 5–15)
BUN: 9 mg/dL (ref 8–23)
CO2: 19 mmol/L — ABNORMAL LOW (ref 22–32)
Calcium: 8.9 mg/dL (ref 8.9–10.3)
Chloride: 104 mmol/L (ref 98–111)
Creatinine, Ser: 0.69 mg/dL (ref 0.61–1.24)
GFR calc Af Amer: >60 mL/min (ref >60)
GFR calc non Af Amer: >60 mL/min (ref >60)
Glucose, Bld: 130 mg/dL — ABNORMAL HIGH (ref 70–99)
Potassium: 4.2 mmol/L (ref 3.5–5.1)
Sodium: 140 mmol/L (ref 135–145)
Total Bilirubin: 0.5 mg/dL (ref 0.3–1.2)
Total Protein: 7.7 g/dL (ref 6.5–8.1)

## 2019-11-28 LAB — ETHANOL: Alcohol, Ethyl (B): 163 mg/dL — ABNORMAL HIGH (ref <10)

## 2019-11-28 LAB — SARS CORONAVIRUS 2 BY RT PCR (HOSPITAL ORDER, PERFORMED IN ~~LOC~~ HOSPITAL LAB): SARS Coronavirus 2: NEGATIVE

## 2019-11-28 MED ORDER — ALUM & MAG HYDROXIDE-SIMETH 200-200-20 MG/5ML PO SUSP: 30.0000 mL | ORAL | Status: DC | PRN

## 2019-11-28 MED ORDER — ACETAMINOPHEN 325 MG PO TABS: 650.0000 mg | ORAL_TABLET | Freq: Four times a day (QID) | ORAL | Status: DC | PRN

## 2019-11-28 MED ORDER — MAGNESIUM HYDROXIDE 400 MG/5ML PO SUSP: 30.0000 mL | Freq: Every day | ORAL | Status: DC | PRN
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
## 2019-11-28 NOTE — ED Notes (Signed)
Patient dressed in burgundy scrubs and belongings placed in bag at nursing station.  Patient smells of stool and stool present on his clothing he removed and place in bag.  Patient was refusing ordered blood work, educated patient on the fact that he is here under IVC and we would be obtaining needed blood work.  Patient willingly complied.  Patient stating that he cannot give urine sample at this time, made him aware we will give him 30 minutes to provide sample or we may have to in and out cath him for sample.

## 2019-11-28 NOTE — ED Notes (Signed)
Pt presented to TCU with 1 pt belonging bag located in locker 31
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
## 2019-11-28 NOTE — ED Provider Notes (Signed)
Contacted by patient's primary doctor - Dr. Zadie Rhine.  She would like a call from his treating Psychiatrist at [phone].   Quintella Reichert, MD 11/28/19 [phone]
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
## 2019-11-28 NOTE — Progress Notes (Signed)
Adult Psychoeducational Group Note  Date:  11/28/2019 Time:  10:45 PM  Group Topic/Focus:  Wrap-Up Group:   The focus of this group is to help patients review their daily goal of treatment and discuss progress on daily workbooks.  Participation Level:  Did Not Attend  Participation Quality:  Did not attend.  Affect:  Did not attend.  Cognitive:   Did not attend.  Insight: None  Engagement in Group:  Did not attend.  Modes of Intervention:  Did not attend.  Additional Comments:  Kaymen is new admit, was brought to the unit after the group activity.  Jenifer Struve  Patricia Pesa 11/28/2019, 10:45 PM

## 2019-11-28 NOTE — ED Notes (Signed)
Pt to room 31. Pt alert, no s/s of distress. Pt oriented to room and unit.
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
## 2019-11-28 NOTE — ED Notes (Signed)
Patient Leaving for [facility] at this time.

## 2019-11-29 DIAGNOSIS — F1024 Alcohol dependence with alcohol-induced mood disorder: Secondary | ICD-10-CM | POA: Diagnosis not present

## 2019-11-29 MED ORDER — LORAZEPAM 1 MG PO TABS: 1.0000 mg | ORAL_TABLET | Freq: Four times a day (QID) | ORAL | Status: DC | PRN

## 2019-11-29 MED ORDER — FOLIC ACID 1 MG PO TABS
1.0000 mg | ORAL_TABLET | Freq: Every day | ORAL | Status: DC
  Administered 2019-11-29 – 2019-11-30 (×2): 1 mg via ORAL
  Filled 2019-11-29 (×5): qty 1

## 2019-11-29 MED ORDER — THIAMINE HCL 100 MG PO TABS
100.0000 mg | ORAL_TABLET | Freq: Every day | ORAL | Status: DC
  Administered 2019-11-29 – 2019-11-30 (×2): 100 mg via ORAL
  Filled 2019-11-29 (×5): qty 1

## 2019-11-29 MED ORDER — SERTRALINE HCL 100 MG PO TABS
100.0000 mg | ORAL_TABLET | Freq: Every day | ORAL | Status: DC
  Administered 2019-11-29 – 2019-11-30 (×2): 100 mg via ORAL
  Filled 2019-11-29 (×6): qty 1

## 2019-11-29 MED ORDER — METOPROLOL TARTRATE 25 MG PO TABS
25.0000 mg | ORAL_TABLET | Freq: Two times a day (BID) | ORAL | Status: DC
  Administered 2019-11-29 (×2): 25 mg via ORAL
  Filled 2019-11-29 (×5): qty 1
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
## 2019-11-29 NOTE — Plan of Care (Signed)
Nurse discussed anxiety, depression and coping skills with patient.  

## 2019-11-29 NOTE — Progress Notes (Signed)
D:  Patient's self inventory sheet, patient has fair sleep, no sleep medication.  Good appetite, normal energy level, good concentration.  Denied anxiety, depression and hopeless.  Denied withdrawals.  Denied SI.  Denied physical problems.   Goal to keep busy.  Will work on jigsaw puzzles and word search puzzles.  No discharge plans. A:  Medications administered per MD orders.  Emotional support and encouragement given  patient. R:  Denied SI and HI, contracts for safety.  Denied A/V hallucinations,  Safety maintained with 15 minute checks.

## 2019-11-30 DIAGNOSIS — F1024 Alcohol dependence with alcohol-induced mood disorder: Secondary | ICD-10-CM | POA: Diagnosis not present

## 2019-11-30 MED ORDER — METOPROLOL TARTRATE 50 MG PO TABS
50.0000 mg | ORAL_TABLET | Freq: Two times a day (BID) | ORAL | Status: DC
  Administered 2019-11-30 (×2): 50 mg via ORAL
  Filled 2019-11-30 (×5): qty 1
  Filled 2019-11-30: qty 2

## 2019-11-30 NOTE — Progress Notes (Signed)
   11/30/19 2005  COVID-19 Daily Checkoff  Have you had a fever (temp > 37.80C/100F)  in the past 24 hours?  No  COVID-19 EXPOSURE  Have you traveled outside the state in the past 14 days? No  Have you been in contact with someone with a confirmed diagnosis of COVID-19 or PUI in the past 14 days without wearing appropriate PPE? No  Have you been living in the same home as a person with confirmed diagnosis of COVID-19 or a PUI (household contact)? No  Have you been diagnosed with COVID-19? No

## 2019-11-30 NOTE — Progress Notes (Signed)
Adult Psychoeducational Group Note  Date:  11/30/2019 Time:  8:29 PM  Group Topic/Focus:  Wrap-Up Group:   The focus of this group is to help patients review their daily goal of treatment and discuss progress on daily workbooks.  Participation Level:  Active  Participation Quality:  Attentive  Affect:  Appropriate  Cognitive:  Appropriate  Insight: Appropriate  Engagement in Group:  Engaged  Modes of Intervention:  Discussion  Additional Comments:  In group pt's shared why they are here and how they plan on working towards their goal. Group was more of a group discussion than individually speaking.  Veronda Prude 11/30/2019, 8:29 PM
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
## 2019-11-30 NOTE — Tx Team (Signed)
Interdisciplinary Treatment and Diagnostic Plan Update  11/30/2019 Time of Session: 9:00am Johnny Newton MRN: [id number]  Principal Diagnosis: Alcohol dependence with alcohol-induced mood disorder ([place])  Secondary Diagnoses: Principal Problem:   Alcohol dependence with alcohol-induced mood disorder ([place]) Active Problems:   MDD (major depressive disorder), severe ([place])   Current Medications:  Current Facility-Administered Medications  Medication Dose Route Frequency Provider Last Rate Last Admin  . alum & mag hydroxide-simeth (MAALOX/MYLANTA) 200-200-20 MG/5ML suspension 30 mL  30 mL Oral Q4H PRN Rankin, Shuvon B, NP      . folic acid (FOLVITE) tablet 1 mg  1 mg Oral Daily Sharma Covert, MD   1 mg at 11/30/19 0754  . LORazepam (ATIVAN) tablet 1 mg  1 mg Oral Q6H PRN Sharma Covert, MD      . magnesium hydroxide (MILK OF MAGNESIA) suspension 30 mL  30 mL Oral Daily PRN Rankin, Shuvon B, NP      . metoprolol tartrate (LOPRESSOR) tablet 50 mg  50 mg Oral BID Sharma Covert, MD   50 mg at 11/30/19 0753  . sertraline (ZOLOFT) tablet 100 mg  100 mg Oral Daily Sharma Covert, MD   100 mg at 11/30/19 0754  . thiamine tablet 100 mg  100 mg Oral Daily Sharma Covert, MD   100 mg at 11/30/19 0754   PTA Medications: Medications Prior to Admission  Medication Sig Dispense Refill Last Dose  . calcium carbonate (TUMS - DOSED IN MG ELEMENTAL CALCIUM) 500 MG chewable tablet Chew 1 tablet (200 mg of elemental calcium total) by mouth 3 (three) times daily as needed for indigestion or heartburn. (Patient not taking: Reported on 11/10/2019)     . hydrOXYzine (ATARAX/VISTARIL) 25 MG tablet Take 1 tablet (25 mg total) by mouth 3 (three) times daily as needed for anxiety. (Patient not taking: Reported on 11/10/2019) 60 tablet 0   . metoprolol succinate (TOPROL-XL) 50 MG 24 hr tablet Take 1 tablet (50 mg total) by mouth daily. For high blood pressure  0   . naltrexone (DEPADE) 50 MG tablet  Take 1 tablet (50 mg total) by mouth daily. For alcoholism (Patient not taking: Reported on 11/10/2019) 30 tablet 0   . neomycin-bacitracin-polymyxin (NEOSPORIN) ointment Apply 1 application topically as needed for wound care.     . sertraline (ZOLOFT) 100 MG tablet Take 100 mg by mouth daily.     . sertraline (ZOLOFT) 50 MG tablet Take 3 tablets (150 mg total) by mouth daily. For depression (Patient not taking: Reported on 11/10/2019) 90 tablet 0   . traZODone (DESYREL) 50 MG tablet Take 1 tablet (50 mg total) by mouth at bedtime. For insomnia (Patient not taking: Reported on 11/10/2019) 30 tablet 0     Patient Stressors:    Patient Strengths: Ability for insight Average or above average intelligence Capable of independent living Communication skills Supportive family/friends  Treatment Modalities: Medication Management, Group therapy, Case management,  1 to 1 session with clinician, Psychoeducation, Recreational therapy.   Physician Treatment Plan for Primary Diagnosis: Alcohol dependence with alcohol-induced mood disorder ([place]) Long Term Goal(s): Improvement in symptoms so as ready for discharge Improvement in symptoms so as ready for discharge   Short Term Goals: Ability to identify changes in lifestyle to reduce recurrence of condition will improve Ability to verbalize feelings will improve Ability to disclose and discuss suicidal ideas Ability to demonstrate self-control will improve Ability to identify and develop effective coping behaviors will improve Ability to maintain clinical  measurements within normal limits will improve Compliance with prescribed medications will improve Ability to identify triggers associated with substance abuse/mental health issues will improve  Medication Management: Evaluate patient's response, side effects, and tolerance of medication regimen.  Therapeutic Interventions: 1 to 1 sessions, Unit Group sessions and Medication  administration.  Evaluation of Outcomes: Progressing  Physician Treatment Plan for Secondary Diagnosis: Principal Problem:   Alcohol dependence with alcohol-induced mood disorder ([place]) Active Problems:   MDD (major depressive disorder), severe ([place])  Long Term Goal(s): Improvement in symptoms so as ready for discharge Improvement in symptoms so as ready for discharge   Short Term Goals: Ability to identify changes in lifestyle to reduce recurrence of condition will improve Ability to verbalize feelings will improve Ability to disclose and discuss suicidal ideas Ability to demonstrate self-control will improve Ability to identify and develop effective coping behaviors will improve Ability to maintain clinical measurements within normal limits will improve Compliance with prescribed medications will improve Ability to identify triggers associated with substance abuse/mental health issues will improve     Medication Management: Evaluate patient's response, side effects, and tolerance of medication regimen.  Therapeutic Interventions: 1 to 1 sessions, Unit Group sessions and Medication administration.  Evaluation of Outcomes: Progressing   RN Treatment Plan for Primary Diagnosis: Alcohol dependence with alcohol-induced mood disorder ([place]) Long Term Goal(s): Knowledge of disease and therapeutic regimen to maintain health will improve  Short Term Goals: Ability to remain free from injury will improve, Ability to verbalize frustration and anger appropriately will improve, Ability to identify and develop effective coping behaviors will improve and Compliance with prescribed medications will improve  Medication Management: RN will administer medications as ordered by provider, will assess and evaluate patient's response and provide education to patient for prescribed medication. RN will report any adverse and/or side effects to prescribing provider.  Therapeutic Interventions: 1 on 1  counseling sessions, Psychoeducation, Medication administration, Evaluate responses to treatment, Monitor vital signs and CBGs as ordered, Perform/monitor CIWA, COWS, AIMS and Fall Risk screenings as ordered, Perform wound care treatments as ordered.  Evaluation of Outcomes: Progressing   LCSW Treatment Plan for Primary Diagnosis: Alcohol dependence with alcohol-induced mood disorder ([place]) Long Term Goal(s): Safe transition to appropriate next level of care at discharge, Engage patient in therapeutic group addressing interpersonal concerns.  Short Term Goals: Engage patient in aftercare planning with referrals and resources, Increase social support, Facilitate patient progression through stages of change regarding substance use diagnoses and concerns, Identify triggers associated with mental health/substance abuse issues and Increase skills for wellness and recovery  Therapeutic Interventions: Assess for all discharge needs, 1 to 1 time with Social worker, Explore available resources and support systems, Assess for adequacy in community support network, Educate family and significant other(s) on suicide prevention, Complete Psychosocial Assessment, Interpersonal group therapy.  Evaluation of Outcomes: Progressing   Progress in Treatment: Attending groups: No. Participating in groups: No. Taking medication as prescribed: Yes. Toleration medication: Yes. Family/Significant other contact made: Yes, individual(s) contacted:  sister. Patient understands diagnosis: Yes. Discussing patient identified problems/goals with staff: Yes. Medical problems stabilized or resolved: Yes. Denies suicidal/homicidal ideation: Yes. Issues/concerns per patient self-inventory: No.   New problem(s) identified: No, Describe:  none.  New Short Term/Long Term Goal(s): medication stabilization, elimination of SI thoughts, development of comprehensive mental wellness plan.   Patient Goals:  "To stop  drinking"  Discharge Plan or Barriers: Patient is to return home and has been referred to the [facility] for [facility].  Reason  for Continuation of Hospitalization: Medication stabilization Withdrawal symptoms  Estimated Length of Stay: 1-3 days  Attendees: Patient: Johnny Newton 11/30/2019   Physician: Myles Lipps, MD 11/30/2019   Nursing:  11/30/2019   RN Care Manager: 11/30/2019   Social Worker: Darletta Moll, LCSW 11/30/2019   Recreational Therapist:  11/30/2019   Other:  11/30/2019   Other:  11/30/2019   Other: 11/30/2019    Scribe for Treatment Team: Vassie Moselle, LCSW 11/30/2019 2:00 PM

## 2019-11-30 NOTE — Progress Notes (Addendum)
D:  Patient's self inventory sheet, patient sleeps good, no sleep medication.  Good appetite, normal energy level, good concentration.  Denied depression, hopeless and anxiety.  Withdrawals denied.  Denied SI.  Denied physical problems.  Goal is discharge, clean up and go to [facility] to see wife, 2 strokes and cancer surgery.  Plans to pray.  No discharge plans. A:  Medications administered per MD orders.  Emotional support and encouragements given patient. R:  Denied SI and HI, contracts for safety.  Denied A/V hallucinations. R:  Patient denied SI and HI, contracts for safety.  Denied A/V hallucinations.  Safety maintained with 15 minute checks.

## 2019-11-30 NOTE — Plan of Care (Signed)
Nurse discussed anxiety, depression and coping skills with patient.  

## 2019-11-30 NOTE — Progress Notes (Signed)
Patient remains cooperative with treatment.He was quite on shift He denies SI/AVH/HI and depression. He appears to be in bed eyes closed resting quietly on shift with no issues to report at this time

## 2019-11-30 NOTE — [facility] Counselor (Signed)
Adult Comprehensive Assessment  Patient ID: Johnny Newton, male   DOB: October 28, 1955, 64 y.o.   MRN: [id number]  Information Source:    Current Stressors:  Patient states their primary concerns and needs for treatment are:: "I've got to quit drinking" Patient states their goals for this hospitilization and ongoing recovery are:: "Really, just to get out of her" Educational / Learning stressors: none Employment / Job issues: none Family Relationships: none Museum/gallery curator / Lack of resources (include bankruptcy): "We're okay, but having more money would always be good" Housing / Lack of housing: none Physical health (include injuries & life threatening diseases): none Social relationships: "I have a neighbor that hates me. They tried to get a 50-B on me but the judge rejected it. I just sit in my front lawn and drink. I guess they have a problem with that" Substance abuse: "I don't drink during the week but I do on the weekends. Maybe just two beers and a glass of wine each day Fri - Sunday" Bereavement / Loss: "My wife had two strokes in June and was just diagnosed with bladder cancer. It's been devestating. We've been married for over 30 years"  Living/Environment/Situation:  Living Arrangements: Spouse/significant other Living conditions (as described by patient or guardian): Pt and spouse live in a home in [place] Who else lives in the home?: one dog - 31 y/o [place] lab How long has patient lived in current situation?: 16 years What is atmosphere in current home: Comfortable  Family History:  Marital status: Married Number of Years Married: 106 What types of issues is patient dealing with in the relationship?: There is stress regarding his wife's recent cancer diagnosis. She is currently in hospital Are you sexually active?: Yes What is your sexual orientation?: straight Has your sexual activity been affected by drugs, alcohol, medication, or emotional stress?: none reported Does  patient have children?: No  Childhood History:  By whom was/is the patient raised?: Both parents Additional childhood history information: "I had a good chilhood" Description of patient's relationship with caregiver when they were a child: "My parents were loving and kind to [place] all" Patient's description of current relationship with people who raised him/her: Pt's parents are deceased How were you disciplined when you got in trouble as a child/adolescent?: "I was spanked. Nothing too bad, and I probably deserved it!" Does patient have siblings?: Yes Number of Siblings: 3 Description of patient's current relationship with siblings: Very good - sister Tropic in [place], [place], brother in [place], sister in [place], [place], sister in [place], [place] Did patient suffer any verbal/emotional/physical/sexual abuse as a child?: No Did patient suffer from severe childhood neglect?: No Has patient ever been sexually abused/assaulted/raped as an adolescent or adult?: No Was the patient ever a victim of a crime or a disaster?: No Witnessed domestic violence?: No Has patient been affected by domestic violence as an adult?: No  Education:  Highest grade of school patient has completed: 12th Currently a student?: No Learning disability?: No  Employment/Work Situation:   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 employed?: self-employed as a Programme researcher, broadcasting/film/video How long has patient been employed?: 30 years Patient's job has been impacted by current illness: No What is the longest time patient has a held a job?: n/a Where was the patient employed at that time?: n/a Has patient ever been in the [employer]?: No  Financial Resources:   Financial resources: Income from employment, Income from spouse Does patient have a representative payee or guardian?: No  Alcohol/Substance Abuse:  What has been your use of drugs/alcohol within the last 12 months?: Pt reports starting in 2018, having over a year of sobriety (AA group helped  a great deal) and relapsing during the pandemic. Current reported pattern: several beers and one or two glasses of wine each day during the weekend. He denies drinking during the week If attempted suicide, did drugs/alcohol play a role in this?: No Alcohol/Substance Abuse Treatment Hx: Attends AA/NA Has alcohol/substance abuse ever caused legal problems?: Yes (DUI at age 44)  Social Support System:   Patient's Community Support System: Manufacturing engineer System: Close and loving relationship with siblings Type of faith/religion: Chrisitan How does patient's faith help to cope with current illness?: "It's helpful"  Leisure/Recreation:   Do You Have Hobbies?: Yes Leisure and Hobbies: woodworking, yardwork  Strengths/Needs:   What is the patient's perception of their strengths?: "loving, kind, really smart, artistic" Patient states they can use these personal strengths during their treatment to contribute to their recovery: Learning to be kind and loving to self Patient states these barriers may affect/interfere with their treatment: continued alcohol use Patient states these barriers may affect their return to the community: none identified Other important information patient would like considered in planning for their treatment: none reported  Discharge Plan:   Currently receiving community mental health services: No Patient states concerns and preferences for aftercare planning are: none Patient states they will know when they are safe and ready for discharge when: "I'm ready now" Does patient have access to transportation?: Yes Does patient have financial barriers related to discharge medications?: No Will patient be returning to same living situation after discharge?: Yes  Summary/Recommendations:   Summary and Recommendations (to be completed by the evaluator): Pt is a 64 y/o, White, married male who came to [facility] under IVC. Pt is cooperative and oriented x4 during this  interaction. He denies SI, HI, and A/V halucinations. He appears to be in the Contemplation Stage of Change - recognizing that his alcohol use is problematic, but has been unable to abstain. He recognizes that he can become, "a different person" when intoxicated and that alcohol worsens his symtpoms of depression. Pt as been attending AA and was able to maintain over one year of sobriety in the past by attending the support group. He was not as interested in outpatient therapy or SAIOP, but would like more information on the services. Pt's most significant stressor currently is his wife's health. They have been married for over 30 years and she has a recent diagnosis of cancer. He has been drinking alcohol more since her health problems began in June of this year. Along with his AA family, he identifies his 3 siblings as supports, especially his sister [name] who lives in [place]. He signed a ROI for clincial staff to communicate with her. While here, pt will benefit from crisis stabilization, medication management, therapeutic milieu, and referrals for services.  Audree Camel, LCSW, [place] [facility]position [facility]/TTS [phone]

## 2019-12-01 MED ORDER — SERTRALINE HCL 100 MG PO TABS: 100.0000 mg | ORAL_TABLET | Freq: Every day | ORAL | 0 refills | Status: DC

## 2019-12-01 NOTE — Progress Notes (Signed)
RN met with pt and reviewed pt's discharge instructions.  Pt verbalized understanding of discharge instructions and pt did not have any questions. RN reviewed and provided pt with a copy of SRA, AVS and Transition Record.  RN returned pt's belongings to pt.  Prescriptions were given to pt.  Pt denied SI/HI/AVH and voiced no concerns.  Pt was appreciative of the care pt received at [facility]. Patient discharged to the lobby without incident. 

## 2019-12-01 NOTE — Progress Notes (Signed)
With assistance from [place], NP, this RN removed 3 staples from pt's left leg.  Pt said he "picked out" the other staples that were on his leg.   Minimal bleeding noted.  Steri-strips applied.

## 2019-12-01 NOTE — [facility] Suicide Risk Assessment (Signed)
[place] INPATIENT:  Family/Significant Other Suicide Prevention Education  Suicide Prevention Education:  Education Completed;  Bernita Raisin Sister [phone]   ,  (name of family member/significant other) has been identified by the patient as the family member/significant other with whom the patient will be residing, and identified as the person(s) who will aid the patient in the event of a mental health crisis (suicidal ideations/suicide attempt).  With written consent from the patient, the family member/significant other has been provided the following suicide prevention education, prior to the and/or following the discharge of the patient.  The suicide prevention education provided includes the following:  Suicide risk factors  Suicide prevention and interventions  National Suicide Hotline telephone number  Ambulatory Care Center assessment telephone number  [facility] Emergency Assistance [place] and/or Residential Mobile Crisis Unit telephone number  Request made of family/significant other to:  Remove weapons (e.g., guns, rifles, knives), all items previously/currently identified as safety concern.    Remove drugs/medications (over-the-counter, prescriptions, illicit drugs), all items previously/currently identified as a safety concern.  The family member/significant other verbalizes understanding of the suicide prevention education information provided.  The family member/significant other agrees to remove the items of safety concern listed above.  Bethann Berkshire 12/01/2019, 9:34 AM

## 2019-12-01 NOTE — [facility] Suicide Risk Assessment (Signed)
[facility] Discharge Suicide Risk Assessment   Principal Problem: Alcohol dependence with alcohol-induced mood disorder [facility]) Discharge Diagnoses: Principal Problem:   Alcohol dependence with alcohol-induced mood disorder ([place]) Active Problems:   MDD (major depressive disorder), severe ([place])   Total Time spent with patient: 15 minutes  Musculoskeletal: Strength & Muscle Tone: within normal limits Gait & Station: normal Patient leans: N/A  Psychiatric Specialty Exam: Review of Systems  All other systems reviewed and are negative.   Blood pressure (!) 129/101, pulse (!) 105, temperature 98 F (36.7 C), temperature source Oral, resp. rate 16, height 5' 5.75" (1.67 m), weight 71.7 kg, SpO2 98 %.Body mass index is 25.7 kg/m.  General Appearance: Casual  Eye Contact::  Good  Speech:  Normal Rate409  Volume:  Normal  Mood:  Euthymic  Affect:  Congruent  Thought Process:  Coherent and Descriptions of Associations: Intact  Orientation:  Full (Time, Place, and Person)  Thought Content:  Logical  Suicidal Thoughts:  No  Homicidal Thoughts:  No  Memory:  Immediate;   Good Recent;   Good Remote;   Good  Judgement:  Intact  Insight:  Fair  Psychomotor Activity:  Normal  Concentration:  Good  Recall:  Good  Fund of Knowledge:Good  Language: Good  Akathisia:  Negative  Handed:  Right  AIMS (if indicated):     Assets:  Desire for Improvement Financial Resources/Insurance Resilience Social Support  Sleep:  Number of Hours: 6.75  Cognition: WNL  ADL's:  Intact   Mental Status Per Nursing Assessment::   On Admission:  NA  Demographic Factors:  Male and Caucasian  Loss Factors: NA  Historical Factors: Impulsivity  Risk Reduction Factors:   Sense of responsibility to family, Employed, Living with another person, especially a relative and Positive social support  Continued Clinical Symptoms:  Depression:   Comorbid alcohol abuse/dependence Impulsivity Alcohol/Substance  Abuse/Dependencies  Cognitive Features That Contribute To Risk:  None    Suicide Risk:  Minimal: No identifiable suicidal ideation.  Patients presenting with no risk factors but with morbid ruminations; may be classified as minimal risk based on the severity of the depressive symptoms   [place], [facility]. Go on 12/07/2019.   Specialty: Behavioral Health Why: You have an appointment on 12/07/19 at 10:00 am to start substance abuse intensive outpatient services.  This appointment will be held in person.  Contact information: [address] [place] [address] [phone]               Plan Of Care/Follow-up recommendations:  Activity:  ad lib  Sharma Covert, MD 12/01/2019, 7:20 AM
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
## 2019-12-01 NOTE — Progress Notes (Signed)
  [facility] Discharge Plan :  Will you be returning to the same living situation after discharge:  Yes,  home At discharge, do you have transportation home?: Yes,  sister Do you have the ability to pay for your medications: Yes,  bcbs  Release of information consent forms completed and in the chart;  Patient's signature needed at discharge.  Patient to Follow up at:  [place]. Go on 12/07/2019.   Specialty: Behavioral Health Why: You have an appointment on 12/07/19 at 10:00 am to start substance abuse intensive outpatient services.  This appointment will be held in person.  Contact information: [place] [address] [phone]               Next level of care provider has access to [place] and Suicide Prevention discussed: Yes,  Sister [name] Russel-Magro     Has patient been referred to the Quitline?: N/A patient is not a smoker  Patient has been referred for addiction treatment: Yes  Bethann Berkshire, [place] 12/01/2019, 9:36 AM

## 2019-12-06 DIAGNOSIS — Z1283 Encounter for screening for malignant neoplasm of skin: Secondary | ICD-10-CM | POA: Diagnosis not present

## 2019-12-06 DIAGNOSIS — Z85828 Personal history of other malignant neoplasm of skin: Secondary | ICD-10-CM | POA: Diagnosis not present

## 2019-12-06 DIAGNOSIS — Z08 Encounter for follow-up examination after completed treatment for malignant neoplasm: Secondary | ICD-10-CM | POA: Diagnosis not present

## 2019-12-06 DIAGNOSIS — Z85820 Personal history of malignant melanoma of skin: Secondary | ICD-10-CM | POA: Diagnosis not present

## 2020-01-06 DIAGNOSIS — F329 Major depressive disorder, single episode, unspecified: Secondary | ICD-10-CM | POA: Diagnosis not present

## 2020-01-06 DIAGNOSIS — F1020 Alcohol dependence, uncomplicated: Secondary | ICD-10-CM | POA: Diagnosis not present

## 2020-01-07 DIAGNOSIS — F1020 Alcohol dependence, uncomplicated: Secondary | ICD-10-CM | POA: Diagnosis not present

## 2020-01-07 DIAGNOSIS — F329 Major depressive disorder, single episode, unspecified: Secondary | ICD-10-CM | POA: Diagnosis not present

## 2020-01-08 DIAGNOSIS — F1020 Alcohol dependence, uncomplicated: Secondary | ICD-10-CM | POA: Diagnosis not present

## 2020-01-08 DIAGNOSIS — F329 Major depressive disorder, single episode, unspecified: Secondary | ICD-10-CM | POA: Diagnosis not present

## 2020-01-09 DIAGNOSIS — F1020 Alcohol dependence, uncomplicated: Secondary | ICD-10-CM | POA: Diagnosis not present

## 2020-01-09 DIAGNOSIS — F329 Major depressive disorder, single episode, unspecified: Secondary | ICD-10-CM | POA: Diagnosis not present

## 2020-01-10 DIAGNOSIS — F1020 Alcohol dependence, uncomplicated: Secondary | ICD-10-CM | POA: Diagnosis not present

## 2020-01-10 DIAGNOSIS — F329 Major depressive disorder, single episode, unspecified: Secondary | ICD-10-CM | POA: Diagnosis not present

## 2020-01-11 DIAGNOSIS — F1020 Alcohol dependence, uncomplicated: Secondary | ICD-10-CM | POA: Diagnosis not present

## 2020-01-11 DIAGNOSIS — F329 Major depressive disorder, single episode, unspecified: Secondary | ICD-10-CM | POA: Diagnosis not present

## 2020-01-12 DIAGNOSIS — F1020 Alcohol dependence, uncomplicated: Secondary | ICD-10-CM | POA: Diagnosis not present

## 2020-01-12 DIAGNOSIS — F329 Major depressive disorder, single episode, unspecified: Secondary | ICD-10-CM | POA: Diagnosis not present

## 2020-01-13 DIAGNOSIS — F1020 Alcohol dependence, uncomplicated: Secondary | ICD-10-CM | POA: Diagnosis not present

## 2020-01-13 DIAGNOSIS — F329 Major depressive disorder, single episode, unspecified: Secondary | ICD-10-CM | POA: Diagnosis not present

## 2020-01-14 DIAGNOSIS — F329 Major depressive disorder, single episode, unspecified: Secondary | ICD-10-CM | POA: Diagnosis not present

## 2020-01-14 DIAGNOSIS — F1020 Alcohol dependence, uncomplicated: Secondary | ICD-10-CM | POA: Diagnosis not present

## 2020-01-15 DIAGNOSIS — F329 Major depressive disorder, single episode, unspecified: Secondary | ICD-10-CM | POA: Diagnosis not present

## 2020-01-15 DIAGNOSIS — F1020 Alcohol dependence, uncomplicated: Secondary | ICD-10-CM | POA: Diagnosis not present

## 2020-01-16 DIAGNOSIS — F329 Major depressive disorder, single episode, unspecified: Secondary | ICD-10-CM | POA: Diagnosis not present

## 2020-01-16 DIAGNOSIS — F1020 Alcohol dependence, uncomplicated: Secondary | ICD-10-CM | POA: Diagnosis not present

## 2020-01-17 DIAGNOSIS — F1020 Alcohol dependence, uncomplicated: Secondary | ICD-10-CM | POA: Diagnosis not present

## 2020-01-17 DIAGNOSIS — F329 Major depressive disorder, single episode, unspecified: Secondary | ICD-10-CM | POA: Diagnosis not present

## 2020-01-18 DIAGNOSIS — F1020 Alcohol dependence, uncomplicated: Secondary | ICD-10-CM | POA: Diagnosis not present

## 2020-01-18 DIAGNOSIS — F329 Major depressive disorder, single episode, unspecified: Secondary | ICD-10-CM | POA: Diagnosis not present

## 2020-01-19 DIAGNOSIS — F329 Major depressive disorder, single episode, unspecified: Secondary | ICD-10-CM | POA: Diagnosis not present

## 2020-01-19 DIAGNOSIS — F1020 Alcohol dependence, uncomplicated: Secondary | ICD-10-CM | POA: Diagnosis not present

## 2020-01-20 DIAGNOSIS — F329 Major depressive disorder, single episode, unspecified: Secondary | ICD-10-CM | POA: Diagnosis not present

## 2020-01-20 DIAGNOSIS — F1020 Alcohol dependence, uncomplicated: Secondary | ICD-10-CM | POA: Diagnosis not present

## 2020-01-21 DIAGNOSIS — F1020 Alcohol dependence, uncomplicated: Secondary | ICD-10-CM | POA: Diagnosis not present

## 2020-01-21 DIAGNOSIS — F329 Major depressive disorder, single episode, unspecified: Secondary | ICD-10-CM | POA: Diagnosis not present

## 2020-01-22 DIAGNOSIS — F1020 Alcohol dependence, uncomplicated: Secondary | ICD-10-CM | POA: Diagnosis not present

## 2020-01-22 DIAGNOSIS — F329 Major depressive disorder, single episode, unspecified: Secondary | ICD-10-CM | POA: Diagnosis not present

## 2020-01-23 DIAGNOSIS — F329 Major depressive disorder, single episode, unspecified: Secondary | ICD-10-CM | POA: Diagnosis not present

## 2020-01-23 DIAGNOSIS — F1020 Alcohol dependence, uncomplicated: Secondary | ICD-10-CM | POA: Diagnosis not present

## 2020-01-24 DIAGNOSIS — F329 Major depressive disorder, single episode, unspecified: Secondary | ICD-10-CM | POA: Diagnosis not present

## 2020-01-24 DIAGNOSIS — F1020 Alcohol dependence, uncomplicated: Secondary | ICD-10-CM | POA: Diagnosis not present

## 2020-01-25 DIAGNOSIS — F329 Major depressive disorder, single episode, unspecified: Secondary | ICD-10-CM | POA: Diagnosis not present

## 2020-01-25 DIAGNOSIS — F1020 Alcohol dependence, uncomplicated: Secondary | ICD-10-CM | POA: Diagnosis not present

## 2020-01-26 DIAGNOSIS — F329 Major depressive disorder, single episode, unspecified: Secondary | ICD-10-CM | POA: Diagnosis not present

## 2020-01-26 DIAGNOSIS — F1020 Alcohol dependence, uncomplicated: Secondary | ICD-10-CM | POA: Diagnosis not present

## 2020-01-27 DIAGNOSIS — F329 Major depressive disorder, single episode, unspecified: Secondary | ICD-10-CM | POA: Diagnosis not present

## 2020-01-27 DIAGNOSIS — F1020 Alcohol dependence, uncomplicated: Secondary | ICD-10-CM | POA: Diagnosis not present

## 2020-01-28 DIAGNOSIS — F1020 Alcohol dependence, uncomplicated: Secondary | ICD-10-CM | POA: Diagnosis not present

## 2020-01-28 DIAGNOSIS — F329 Major depressive disorder, single episode, unspecified: Secondary | ICD-10-CM | POA: Diagnosis not present

## 2020-01-29 DIAGNOSIS — F1020 Alcohol dependence, uncomplicated: Secondary | ICD-10-CM | POA: Diagnosis not present

## 2020-01-29 DIAGNOSIS — F329 Major depressive disorder, single episode, unspecified: Secondary | ICD-10-CM | POA: Diagnosis not present

## 2020-01-30 DIAGNOSIS — F1020 Alcohol dependence, uncomplicated: Secondary | ICD-10-CM | POA: Diagnosis not present

## 2020-01-30 DIAGNOSIS — F329 Major depressive disorder, single episode, unspecified: Secondary | ICD-10-CM | POA: Diagnosis not present

## 2020-01-31 DIAGNOSIS — F1020 Alcohol dependence, uncomplicated: Secondary | ICD-10-CM | POA: Diagnosis not present

## 2020-01-31 DIAGNOSIS — F329 Major depressive disorder, single episode, unspecified: Secondary | ICD-10-CM | POA: Diagnosis not present

## 2020-02-01 DIAGNOSIS — F1020 Alcohol dependence, uncomplicated: Secondary | ICD-10-CM | POA: Diagnosis not present

## 2020-02-01 DIAGNOSIS — F329 Major depressive disorder, single episode, unspecified: Secondary | ICD-10-CM | POA: Diagnosis not present

## 2020-02-02 DIAGNOSIS — F1020 Alcohol dependence, uncomplicated: Secondary | ICD-10-CM | POA: Diagnosis not present

## 2020-02-02 DIAGNOSIS — F329 Major depressive disorder, single episode, unspecified: Secondary | ICD-10-CM | POA: Diagnosis not present

## 2020-02-06 DIAGNOSIS — F1020 Alcohol dependence, uncomplicated: Secondary | ICD-10-CM | POA: Diagnosis not present

## 2020-02-06 DIAGNOSIS — F329 Major depressive disorder, single episode, unspecified: Secondary | ICD-10-CM | POA: Diagnosis not present

## 2020-02-08 DIAGNOSIS — F329 Major depressive disorder, single episode, unspecified: Secondary | ICD-10-CM | POA: Diagnosis not present

## 2020-02-08 DIAGNOSIS — F1020 Alcohol dependence, uncomplicated: Secondary | ICD-10-CM | POA: Diagnosis not present

## 2020-02-12 DIAGNOSIS — F329 Major depressive disorder, single episode, unspecified: Secondary | ICD-10-CM | POA: Diagnosis not present

## 2020-02-12 DIAGNOSIS — F1020 Alcohol dependence, uncomplicated: Secondary | ICD-10-CM | POA: Diagnosis not present

## 2020-02-13 DIAGNOSIS — F329 Major depressive disorder, single episode, unspecified: Secondary | ICD-10-CM | POA: Diagnosis not present

## 2020-02-13 DIAGNOSIS — F1020 Alcohol dependence, uncomplicated: Secondary | ICD-10-CM | POA: Diagnosis not present

## 2020-02-15 DIAGNOSIS — F329 Major depressive disorder, single episode, unspecified: Secondary | ICD-10-CM | POA: Diagnosis not present

## 2020-02-15 DIAGNOSIS — F1020 Alcohol dependence, uncomplicated: Secondary | ICD-10-CM | POA: Diagnosis not present

## 2020-02-19 DIAGNOSIS — F1020 Alcohol dependence, uncomplicated: Secondary | ICD-10-CM | POA: Diagnosis not present

## 2020-02-19 DIAGNOSIS — F329 Major depressive disorder, single episode, unspecified: Secondary | ICD-10-CM | POA: Diagnosis not present

## 2020-02-20 DIAGNOSIS — F329 Major depressive disorder, single episode, unspecified: Secondary | ICD-10-CM | POA: Diagnosis not present

## 2020-02-20 DIAGNOSIS — F1020 Alcohol dependence, uncomplicated: Secondary | ICD-10-CM | POA: Diagnosis not present

## 2020-02-22 DIAGNOSIS — F329 Major depressive disorder, single episode, unspecified: Secondary | ICD-10-CM | POA: Diagnosis not present

## 2020-02-22 DIAGNOSIS — F1020 Alcohol dependence, uncomplicated: Secondary | ICD-10-CM | POA: Diagnosis not present

## 2020-02-26 DIAGNOSIS — F1020 Alcohol dependence, uncomplicated: Secondary | ICD-10-CM | POA: Diagnosis not present

## 2020-02-26 DIAGNOSIS — F329 Major depressive disorder, single episode, unspecified: Secondary | ICD-10-CM | POA: Diagnosis not present

## 2020-02-29 DIAGNOSIS — F329 Major depressive disorder, single episode, unspecified: Secondary | ICD-10-CM | POA: Diagnosis not present

## 2020-02-29 DIAGNOSIS — F1020 Alcohol dependence, uncomplicated: Secondary | ICD-10-CM | POA: Diagnosis not present

## 2020-03-05 ENCOUNTER — Emergency Department (HOSPITAL_COMMUNITY)
Admission: EM | Admit: 2020-03-05 | Discharge: 2020-03-06 | Disposition: A | Discharge status: Home / Self Care | Payer: BC Managed Care – PPO | Attending: Emergency Medicine | Admitting: Emergency Medicine

## 2020-03-05 DIAGNOSIS — R45851 Suicidal ideations: Secondary | ICD-10-CM | POA: Diagnosis not present

## 2020-03-05 DIAGNOSIS — R000 Tachycardia, unspecified: Secondary | ICD-10-CM | POA: Diagnosis not present

## 2020-03-05 DIAGNOSIS — Z046 Encounter for general psychiatric examination, requested by authority: Secondary | ICD-10-CM | POA: Insufficient documentation

## 2020-03-05 DIAGNOSIS — I10 Essential (primary) hypertension: Secondary | ICD-10-CM | POA: Insufficient documentation

## 2020-03-05 DIAGNOSIS — Z79899 Other long term (current) drug therapy: Secondary | ICD-10-CM | POA: Insufficient documentation

## 2020-03-05 DIAGNOSIS — F1010 Alcohol abuse, uncomplicated: Secondary | ICD-10-CM | POA: Diagnosis not present

## 2020-03-05 LAB — COMPREHENSIVE METABOLIC PANEL
ALT: 26 U/L (ref 0–44)
AST: 32 U/L (ref 15–41)
Albumin: 4.6 g/dL (ref 3.5–5.0)
Alkaline Phosphatase: 69 U/L (ref 38–126)
Anion gap: 16 — ABNORMAL HIGH (ref 5–15)
BUN: 17 mg/dL (ref 8–23)
CO2: 22 mmol/L (ref 22–32)
Calcium: 9.2 mg/dL (ref 8.9–10.3)
Chloride: 105 mmol/L (ref 98–111)
Creatinine, Ser: 0.94 mg/dL (ref 0.61–1.24)
GFR, Estimated: >60 mL/min (ref >60)
Glucose, Bld: 117 mg/dL — ABNORMAL HIGH (ref 70–99)
Potassium: 3.6 mmol/L (ref 3.5–5.1)
Sodium: 143 mmol/L (ref 135–145)
Total Bilirubin: 0.6 mg/dL (ref 0.3–1.2)
Total Protein: 7.9 g/dL (ref 6.5–8.1)

## 2020-03-05 LAB — CBC WITH DIFFERENTIAL/PLATELET
Abs Immature Granulocytes: 0.03 10*3/uL (ref 0.00–0.07)
Basophils Absolute: 0.1 10*3/uL (ref 0.0–0.1)
Basophils Relative: 1 %
Eosinophils Absolute: 0 10*3/uL (ref 0.0–0.5)
Eosinophils Relative: 0 %
HCT: 44.9 % (ref 39.0–52.0)
Hemoglobin: 14.7 g/dL (ref 13.0–17.0)
Immature Granulocytes: 0 %
Lymphocytes Relative: 15 %
Lymphs Abs: 1.4 10*3/uL (ref 0.7–4.0)
MCH: 29.3 pg (ref 26.0–34.0)
MCHC: 32.7 g/dL (ref 30.0–36.0)
MCV: 89.6 fL (ref 80.0–100.0)
Monocytes Absolute: 0.7 10*3/uL (ref 0.1–1.0)
Monocytes Relative: 8 %
Neutro Abs: 7 10*3/uL (ref 1.7–7.7)
Neutrophils Relative %: 76 %
Platelets: 281 10*3/uL (ref 150–400)
RBC: 5.01 MIL/uL (ref 4.22–5.81)
RDW: 13 % (ref 11.5–15.5)
WBC: 9.3 10*3/uL (ref 4.0–10.5)
nRBC: 0 % (ref 0.0–0.2)

## 2020-03-05 LAB — ETHANOL: Alcohol, Ethyl (B): 175 mg/dL — ABNORMAL HIGH (ref <10)

## 2020-03-05 MED ORDER — METOPROLOL SUCCINATE ER 50 MG PO TB24
50.0000 mg | ORAL_TABLET | Freq: Every day | ORAL | Status: DC
  Administered 2020-03-05 – 2020-03-06 (×2): 50 mg via ORAL
  Filled 2020-03-05 (×2): qty 1

## 2020-03-05 NOTE — ED Notes (Signed)
Patient speaking to sister, Rod Holler, now about giving dog insulin.
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
## 2020-03-05 NOTE — ED Notes (Signed)
Patient states he has a dog at home that needs to get insulin. Patient given his 2 sisters numbers and phone from the unit to call them.

## 2020-03-05 NOTE — ED Notes (Signed)
Pt found wandering around the dept. Ask to please return to his bed. Waiting for TTS evaluation

## 2020-03-05 NOTE — ED Notes (Signed)
[facility]ed [facility] for apparrently presenting there intoxicated and stating suicidal thoughts. Pt is currently IVC's

## 2020-03-05 NOTE — ED Notes (Signed)
Pt has one pt bag in locker 31 which has his cloths in it.

## 2020-03-05 NOTE — ED Provider Notes (Signed)
Johnny Newton Provider Note   CSN: [id number] Arrival date & time: 03/05/20  1322     History Chief Complaint  Patient presents with  . IVC    Johnny Newton is a 64 y.o. male.  Patient is a 64 year old male with a history of alcohol abuse, major depression, prior involuntary commitment's who had recently been at [facility] and had been clean from alcohol for months but then this week had to place his wife in assisted living and been having a bad week.  He reports he had been drinking some and today when he went to [place] he was dropping off some gifts and they noticed he was very well dressed but he mentioned that this would be the last time he would be there and they were concerned that he may want to hurt himself.  They took out IVC paperwork because of his prior history and concern with his recent bad week and resuming alcohol use that he might hurt himself.  Currently patient reports that he loves himself and he would never want to hurt himself.  He did admit to drinking alcohol last night and early this morning but nothing since then.  He denies withdrawal symptoms if he does not have alcohol.  He denies any drug use.  He does report that he has not had his metoprolol for the last 2 days because he forgot to take it but denies any chest pain or shortness of breath.  The history is provided by the patient.       Past Medical History:  Diagnosis Date  . Depression   . Hypertension     Patient Active Problem List   Diagnosis Date Noted  . MDD (major depressive disorder), severe ([place]) 11/28/2019  . Alcohol use disorder, severe, dependence ([place]) 09/29/2016  . Cannabis use disorder, severe, dependence ([place]) 09/29/2016  . Major depressive disorder, recurrent episode with anxious distress ([place]) 09/29/2016  . Toxic encephalopathy 09/24/2016  . Acute encephalopathy 09/23/2016  . Suicidal ideation 09/23/2016  . Rhabdomyolysis 09/23/2016   . Normochromic normocytic anemia 09/23/2016  . ETOH abuse 09/23/2016  . Depression 09/23/2016  . Alcohol dependence with alcohol-induced mood disorder ([place]) 09/23/2016  . Dehydration     No past surgical history on file.     Family History  Family history unknown: Yes    Social History   Tobacco Use  . Smoking status: Never Smoker  . Smokeless tobacco: Never Used  Vaping Use  . Vaping Use: Never used  Substance Use Topics  . Alcohol use: Yes    Alcohol/week: 12.0 - 19.0 standard drinks    Types: 5 - 7 Glasses of wine, 7 - 12 Cans of beer per week    Comment: per family 'he's an alcoholic'  . Drug use: No    Home Medications Prior to Admission medications   Medication Sig Start Date End Date Taking? Authorizing Provider  metoprolol succinate (TOPROL-XL) 50 MG 24 hr tablet Take 1 tablet (50 mg total) by mouth daily. For high blood pressure 09/29/16   Lindell Spar I, NP  sertraline (ZOLOFT) 100 MG tablet Take 1 tablet (100 mg total) by mouth daily. For depression 12/01/19   Encarnacion Slates, NP    Allergies    Patient has no known allergies.  Review of Systems   Review of Systems  All other systems reviewed and are negative.   Physical Exam Updated Vital Signs BP (!) 145/95 (BP Location: Right Arm)  Pulse (!) 127   Temp 98.5 F (36.9 C) (Oral)   Resp (!) 24   SpO2 100%   Physical Exam Vitals and nursing note reviewed.  Constitutional:      General: He is not in acute distress.    Appearance: Normal appearance. He is well-developed and normal weight.  HENT:     Head: Normocephalic and atraumatic.  Eyes:     Conjunctiva/sclera: Conjunctivae normal.     Pupils: Pupils are equal, round, and reactive to light.  Cardiovascular:     Rate and Rhythm: Regular rhythm. Tachycardia present.     Pulses: Normal pulses.     Heart sounds: No murmur heard.   Pulmonary:     Effort: Pulmonary effort is normal. No respiratory distress.     Breath sounds: Normal breath  sounds. No wheezing or rales.  Abdominal:     General: There is no distension.     Palpations: Abdomen is soft.     Tenderness: There is no abdominal tenderness. There is no guarding or rebound.  Musculoskeletal:        General: No tenderness. Normal range of motion.     Cervical back: Normal range of motion and neck supple.  Skin:    General: Skin is warm and dry.     Findings: No erythema or rash.  Neurological:     General: No focal deficit present.     Mental Status: He is alert and oriented to person, place, and time. Mental status is at baseline.  Psychiatric:        Attention and Perception: Attention normal.        Mood and Affect: Mood normal.        Speech: Speech normal.        Behavior: Behavior normal. Behavior is cooperative.        Thought Content: Thought content does not include homicidal or suicidal ideation. Thought content does not include homicidal or suicidal plan.     Comments: Tearful when he starts talking about having a hard week and that somebody would IVC him because he does not want to hurt himself     ED Results / Procedures / Treatments   Labs (all labs ordered are listed, but only abnormal results are displayed) Labs Reviewed  COMPREHENSIVE METABOLIC PANEL - Abnormal; Notable for the following components:      Result Value   Glucose, Bld 117 (*)    Anion gap 16 (*)    All other components within normal limits  ETHANOL - Abnormal; Notable for the following components:   Alcohol, Ethyl (B) 175 (*)    All other components within normal limits  CBC WITH DIFFERENTIAL/PLATELET    EKG EKG Interpretation  Date/Time:  Tuesday March 05 2020 14:45:08 EST Ventricular Rate:  125 PR Interval:  168 QRS Duration: 86 QT Interval:  304 QTC Calculation: 438 R Axis:   112 Text Interpretation: new Sinus tachycardia Possible Right ventricular hypertrophy Confirmed by Blanchie Dessert [phone]) on 03/05/2020 2:58:33 PM   Radiology No results  found.  Procedures Procedures (including critical care time)  Medications Ordered in ED Medications  metoprolol succinate (TOPROL-XL) 24 hr tablet 50 mg (has no administration in time range)    ED Course  I have reviewed the triage vital signs and the nursing notes.  Pertinent labs & imaging results that were available during my care of the patient were reviewed by me and considered in my medical decision making (see chart for details).  MDM Rules/Calculators/A&P                          Patient presenting today under IVC commitment when [facility] took out paperwork because they were concerned that patient was going to hurt himself.  He does have a prior history of suicidal ideation and alcohol abuse.  He has recently had increased stress with his wife moving into assisted living.  Patient denies any suicidal ideation at this time.  However he does admit to relapsing on alcohol.  With his prior history, uncertain if he is taking his psychiatric medication and resumption of alcohol use we will have TTS evaluate.  Patient is tachycardic here but has not has metoprolol in the last 2 days and may just be having reflex tachycardia.  Patient given his home dose of metoprolol.  Will check an EKG and blood work.  3:27 PM Labs are reassuring except for elevated alcohol of 175.  Patient however is clinically sober at this time.  EKG was sinus tachycardia however suspect this will improve and patient gets a dose of his home medication which he has been out of for the last 2 days. Patient is medically clear at this time and we will have TTS evaluate.  MDM Number of Diagnoses or Management Options   Amount and/or Complexity of Data Reviewed Clinical lab tests: ordered and reviewed Tests in the radiology section of CPT: ordered and reviewed Tests in the medicine section of CPT: ordered and reviewed Independent visualization of images, tracings, or specimens: yes  Risk of Complications,  Morbidity, and/or Mortality Presenting problems: moderate Diagnostic procedures: low Management options: low  Patient Progress Patient progress: stable    Final Clinical Impression(s) / ED Diagnoses Final diagnoses:  Suicidal ideation  Alcohol abuse  Sinus tachycardia    Rx / DC Orders ED Discharge Orders    None       Blanchie Dessert, MD 03/05/20 1528

## 2020-03-05 NOTE — ED Notes (Signed)
Report to Cameron RN.

## 2020-03-06 ENCOUNTER — Encounter (HOSPITAL_COMMUNITY): Payer: Self-pay | Admitting: Registered Nurse

## 2020-03-06 NOTE — Discharge Instructions (Addendum)
  We are supplying some information about places to go to get help with alcohol abuse, and further care for problems including suicide, or other psychiatric conditions.  Continue to take your medications as prescribed.

## 2020-03-06 NOTE — ED Notes (Signed)
Pt DC d off unit to home per provider. Pt alert, calm , cooperative, no s/s of distress.  DC information given to and reviewed by pt, pt acknowledged understanding. Belongings given to pt. Pt ambulatory off unit , escorted by Case Manager. Pt transported by taxi.
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
## 2020-03-06 NOTE — ED Provider Notes (Signed)
I communicated with TTS who states they have medically cleared the patient.  At this time the patient is alert and cooperative.  He is goal oriented to follow-up for counseling and will continue to go to [facility].  He states he needs some help getting home and that he does not have issues.  I asked social worker to see the patient.   Daleen Bo, MD 03/06/20 [phone]

## 2020-03-06 NOTE — Progress Notes (Signed)
TOC CM spoke to pt and states he does not have wallet. Agreed to pay for taxi voucher. Contacted [employer] they will need half and will allow pt to pay other half when he gets home. Taxi voucher provided for $13, total cost $25 to [place].  [place], [place] ED TOC CM [phone]

## 2020-03-12 DIAGNOSIS — Z1283 Encounter for screening for malignant neoplasm of skin: Secondary | ICD-10-CM | POA: Diagnosis not present

## 2020-03-12 DIAGNOSIS — L218 Other seborrheic dermatitis: Secondary | ICD-10-CM | POA: Diagnosis not present

## 2020-03-12 DIAGNOSIS — L570 Actinic keratosis: Secondary | ICD-10-CM | POA: Diagnosis not present

## 2020-03-12 DIAGNOSIS — Z85820 Personal history of malignant melanoma of skin: Secondary | ICD-10-CM | POA: Diagnosis not present

## 2020-03-12 DIAGNOSIS — X32XXXA Exposure to sunlight, initial encounter: Secondary | ICD-10-CM | POA: Diagnosis not present

## 2020-03-12 DIAGNOSIS — Z08 Encounter for follow-up examination after completed treatment for malignant neoplasm: Secondary | ICD-10-CM | POA: Diagnosis not present

## 2020-05-21 DIAGNOSIS — F339 Major depressive disorder, recurrent, unspecified: Secondary | ICD-10-CM | POA: Diagnosis not present

## 2020-05-21 DIAGNOSIS — F1020 Alcohol dependence, uncomplicated: Secondary | ICD-10-CM | POA: Diagnosis not present

## 2020-05-31 DIAGNOSIS — C44319 Basal cell carcinoma of skin of other parts of face: Secondary | ICD-10-CM | POA: Diagnosis not present

## 2020-06-05 DIAGNOSIS — Z85820 Personal history of malignant melanoma of skin: Secondary | ICD-10-CM | POA: Diagnosis not present

## 2020-06-05 DIAGNOSIS — D225 Melanocytic nevi of trunk: Secondary | ICD-10-CM | POA: Diagnosis not present

## 2020-06-05 DIAGNOSIS — Z85828 Personal history of other malignant neoplasm of skin: Secondary | ICD-10-CM | POA: Diagnosis not present

## 2020-06-05 DIAGNOSIS — Z08 Encounter for follow-up examination after completed treatment for malignant neoplasm: Secondary | ICD-10-CM | POA: Diagnosis not present

## 2020-06-10 DIAGNOSIS — I10 Essential (primary) hypertension: Secondary | ICD-10-CM | POA: Diagnosis not present

## 2020-06-10 DIAGNOSIS — R634 Abnormal weight loss: Secondary | ICD-10-CM | POA: Diagnosis not present

## 2020-06-10 DIAGNOSIS — F1010 Alcohol abuse, uncomplicated: Secondary | ICD-10-CM | POA: Diagnosis not present

## 2020-06-10 DIAGNOSIS — F339 Major depressive disorder, recurrent, unspecified: Secondary | ICD-10-CM | POA: Diagnosis not present

## 2020-06-17 DIAGNOSIS — F339 Major depressive disorder, recurrent, unspecified: Secondary | ICD-10-CM | POA: Diagnosis not present

## 2020-06-25 DIAGNOSIS — F339 Major depressive disorder, recurrent, unspecified: Secondary | ICD-10-CM | POA: Diagnosis not present

## 2020-06-25 DIAGNOSIS — F1020 Alcohol dependence, uncomplicated: Secondary | ICD-10-CM | POA: Diagnosis not present

## 2020-06-25 DIAGNOSIS — I10 Essential (primary) hypertension: Secondary | ICD-10-CM | POA: Diagnosis not present

## 2020-09-04 DIAGNOSIS — Z20822 Contact with and (suspected) exposure to covid-19: Secondary | ICD-10-CM | POA: Diagnosis not present

## 2020-09-10 DIAGNOSIS — F1020 Alcohol dependence, uncomplicated: Secondary | ICD-10-CM | POA: Diagnosis not present

## 2020-09-10 DIAGNOSIS — I10 Essential (primary) hypertension: Secondary | ICD-10-CM | POA: Diagnosis not present

## 2020-09-10 DIAGNOSIS — F324 Major depressive disorder, single episode, in partial remission: Secondary | ICD-10-CM | POA: Diagnosis not present

## 2020-10-16 DIAGNOSIS — F324 Major depressive disorder, single episode, in partial remission: Secondary | ICD-10-CM | POA: Diagnosis not present

## 2020-10-16 DIAGNOSIS — F1010 Alcohol abuse, uncomplicated: Secondary | ICD-10-CM | POA: Diagnosis not present

## 2020-10-17 DIAGNOSIS — F1020 Alcohol dependence, uncomplicated: Secondary | ICD-10-CM | POA: Diagnosis not present

## 2020-10-18 DIAGNOSIS — F1020 Alcohol dependence, uncomplicated: Secondary | ICD-10-CM | POA: Diagnosis not present

## 2020-10-21 DIAGNOSIS — F1020 Alcohol dependence, uncomplicated: Secondary | ICD-10-CM | POA: Diagnosis not present

## 2020-10-22 DIAGNOSIS — F1020 Alcohol dependence, uncomplicated: Secondary | ICD-10-CM | POA: Diagnosis not present

## 2020-10-23 DIAGNOSIS — F1020 Alcohol dependence, uncomplicated: Secondary | ICD-10-CM | POA: Diagnosis not present

## 2020-10-25 DIAGNOSIS — F1020 Alcohol dependence, uncomplicated: Secondary | ICD-10-CM | POA: Diagnosis not present

## 2020-10-28 DIAGNOSIS — F1020 Alcohol dependence, uncomplicated: Secondary | ICD-10-CM | POA: Diagnosis not present

## 2020-10-30 DIAGNOSIS — F1020 Alcohol dependence, uncomplicated: Secondary | ICD-10-CM | POA: Diagnosis not present

## 2020-11-01 DIAGNOSIS — F1020 Alcohol dependence, uncomplicated: Secondary | ICD-10-CM | POA: Diagnosis not present

## 2020-11-04 DIAGNOSIS — F1020 Alcohol dependence, uncomplicated: Secondary | ICD-10-CM | POA: Diagnosis not present

## 2020-11-06 DIAGNOSIS — F1020 Alcohol dependence, uncomplicated: Secondary | ICD-10-CM | POA: Diagnosis not present

## 2020-11-08 DIAGNOSIS — F1020 Alcohol dependence, uncomplicated: Secondary | ICD-10-CM | POA: Diagnosis not present

## 2020-11-11 DIAGNOSIS — F1020 Alcohol dependence, uncomplicated: Secondary | ICD-10-CM | POA: Diagnosis not present

## 2020-11-13 DIAGNOSIS — F1020 Alcohol dependence, uncomplicated: Secondary | ICD-10-CM | POA: Diagnosis not present

## 2020-11-15 DIAGNOSIS — F1020 Alcohol dependence, uncomplicated: Secondary | ICD-10-CM | POA: Diagnosis not present

## 2020-11-18 DIAGNOSIS — F1020 Alcohol dependence, uncomplicated: Secondary | ICD-10-CM | POA: Diagnosis not present

## 2020-11-19 DIAGNOSIS — D225 Melanocytic nevi of trunk: Secondary | ICD-10-CM | POA: Diagnosis not present

## 2020-11-19 DIAGNOSIS — X32XXXD Exposure to sunlight, subsequent encounter: Secondary | ICD-10-CM | POA: Diagnosis not present

## 2020-11-19 DIAGNOSIS — L570 Actinic keratosis: Secondary | ICD-10-CM | POA: Diagnosis not present

## 2020-11-19 DIAGNOSIS — Z85820 Personal history of malignant melanoma of skin: Secondary | ICD-10-CM | POA: Diagnosis not present

## 2020-11-19 DIAGNOSIS — Z1283 Encounter for screening for malignant neoplasm of skin: Secondary | ICD-10-CM | POA: Diagnosis not present

## 2020-11-19 DIAGNOSIS — Z08 Encounter for follow-up examination after completed treatment for malignant neoplasm: Secondary | ICD-10-CM | POA: Diagnosis not present

## 2020-11-20 DIAGNOSIS — F1020 Alcohol dependence, uncomplicated: Secondary | ICD-10-CM | POA: Diagnosis not present

## 2020-11-22 DIAGNOSIS — F1020 Alcohol dependence, uncomplicated: Secondary | ICD-10-CM | POA: Diagnosis not present

## 2020-11-25 DIAGNOSIS — F1020 Alcohol dependence, uncomplicated: Secondary | ICD-10-CM | POA: Diagnosis not present

## 2020-11-27 DIAGNOSIS — F1020 Alcohol dependence, uncomplicated: Secondary | ICD-10-CM | POA: Diagnosis not present

## 2020-11-29 DIAGNOSIS — F1020 Alcohol dependence, uncomplicated: Secondary | ICD-10-CM | POA: Diagnosis not present

## 2020-12-02 DIAGNOSIS — F1020 Alcohol dependence, uncomplicated: Secondary | ICD-10-CM | POA: Diagnosis not present

## 2020-12-04 DIAGNOSIS — F1020 Alcohol dependence, uncomplicated: Secondary | ICD-10-CM | POA: Diagnosis not present

## 2020-12-06 DIAGNOSIS — F1020 Alcohol dependence, uncomplicated: Secondary | ICD-10-CM | POA: Diagnosis not present

## 2020-12-09 DIAGNOSIS — F1020 Alcohol dependence, uncomplicated: Secondary | ICD-10-CM | POA: Diagnosis not present

## 2020-12-11 DIAGNOSIS — F1020 Alcohol dependence, uncomplicated: Secondary | ICD-10-CM | POA: Diagnosis not present

## 2020-12-13 DIAGNOSIS — F1021 Alcohol dependence, in remission: Secondary | ICD-10-CM | POA: Diagnosis not present

## 2020-12-16 DIAGNOSIS — F1021 Alcohol dependence, in remission: Secondary | ICD-10-CM | POA: Diagnosis not present

## 2020-12-16 DIAGNOSIS — F1020 Alcohol dependence, uncomplicated: Secondary | ICD-10-CM | POA: Diagnosis not present

## 2020-12-18 DIAGNOSIS — F1021 Alcohol dependence, in remission: Secondary | ICD-10-CM | POA: Diagnosis not present

## 2020-12-20 DIAGNOSIS — F1021 Alcohol dependence, in remission: Secondary | ICD-10-CM | POA: Diagnosis not present

## 2020-12-23 DIAGNOSIS — F1020 Alcohol dependence, uncomplicated: Secondary | ICD-10-CM | POA: Diagnosis not present

## 2020-12-25 DIAGNOSIS — F1020 Alcohol dependence, uncomplicated: Secondary | ICD-10-CM | POA: Diagnosis not present

## 2020-12-27 DIAGNOSIS — F1020 Alcohol dependence, uncomplicated: Secondary | ICD-10-CM | POA: Diagnosis not present

## 2020-12-30 DIAGNOSIS — F1020 Alcohol dependence, uncomplicated: Secondary | ICD-10-CM | POA: Diagnosis not present

## 2021-01-01 DIAGNOSIS — F1020 Alcohol dependence, uncomplicated: Secondary | ICD-10-CM | POA: Diagnosis not present

## 2021-01-03 DIAGNOSIS — F1020 Alcohol dependence, uncomplicated: Secondary | ICD-10-CM | POA: Diagnosis not present

## 2021-01-06 DIAGNOSIS — F1020 Alcohol dependence, uncomplicated: Secondary | ICD-10-CM | POA: Diagnosis not present

## 2021-01-08 DIAGNOSIS — F324 Major depressive disorder, single episode, in partial remission: Secondary | ICD-10-CM | POA: Diagnosis not present

## 2021-01-08 DIAGNOSIS — F1020 Alcohol dependence, uncomplicated: Secondary | ICD-10-CM | POA: Diagnosis not present

## 2021-01-10 DIAGNOSIS — F1020 Alcohol dependence, uncomplicated: Secondary | ICD-10-CM | POA: Diagnosis not present

## 2021-01-13 DIAGNOSIS — F1020 Alcohol dependence, uncomplicated: Secondary | ICD-10-CM | POA: Diagnosis not present

## 2021-01-20 DIAGNOSIS — F1020 Alcohol dependence, uncomplicated: Secondary | ICD-10-CM | POA: Diagnosis not present

## 2021-01-22 DIAGNOSIS — F1020 Alcohol dependence, uncomplicated: Secondary | ICD-10-CM | POA: Diagnosis not present

## 2021-01-24 DIAGNOSIS — F1020 Alcohol dependence, uncomplicated: Secondary | ICD-10-CM | POA: Diagnosis not present

## 2021-01-27 DIAGNOSIS — F1020 Alcohol dependence, uncomplicated: Secondary | ICD-10-CM | POA: Diagnosis not present

## 2021-01-29 DIAGNOSIS — F1020 Alcohol dependence, uncomplicated: Secondary | ICD-10-CM | POA: Diagnosis not present

## 2021-01-31 DIAGNOSIS — F1020 Alcohol dependence, uncomplicated: Secondary | ICD-10-CM | POA: Diagnosis not present

## 2021-02-03 DIAGNOSIS — F1020 Alcohol dependence, uncomplicated: Secondary | ICD-10-CM | POA: Diagnosis not present

## 2021-02-05 DIAGNOSIS — F1020 Alcohol dependence, uncomplicated: Secondary | ICD-10-CM | POA: Diagnosis not present

## 2021-02-07 DIAGNOSIS — F1020 Alcohol dependence, uncomplicated: Secondary | ICD-10-CM | POA: Diagnosis not present

## 2021-02-10 DIAGNOSIS — F1020 Alcohol dependence, uncomplicated: Secondary | ICD-10-CM | POA: Diagnosis not present

## 2021-02-12 DIAGNOSIS — F1020 Alcohol dependence, uncomplicated: Secondary | ICD-10-CM | POA: Diagnosis not present

## 2021-02-14 DIAGNOSIS — F1020 Alcohol dependence, uncomplicated: Secondary | ICD-10-CM | POA: Diagnosis not present

## 2021-02-17 DIAGNOSIS — F1020 Alcohol dependence, uncomplicated: Secondary | ICD-10-CM | POA: Diagnosis not present

## 2021-02-19 DIAGNOSIS — F1020 Alcohol dependence, uncomplicated: Secondary | ICD-10-CM | POA: Diagnosis not present

## 2021-02-21 DIAGNOSIS — F1020 Alcohol dependence, uncomplicated: Secondary | ICD-10-CM | POA: Diagnosis not present

## 2021-02-24 DIAGNOSIS — F1020 Alcohol dependence, uncomplicated: Secondary | ICD-10-CM | POA: Diagnosis not present

## 2021-02-26 DIAGNOSIS — F1020 Alcohol dependence, uncomplicated: Secondary | ICD-10-CM | POA: Diagnosis not present

## 2021-02-28 DIAGNOSIS — F1020 Alcohol dependence, uncomplicated: Secondary | ICD-10-CM | POA: Diagnosis not present

## 2021-03-03 DIAGNOSIS — F1020 Alcohol dependence, uncomplicated: Secondary | ICD-10-CM | POA: Diagnosis not present

## 2021-03-05 DIAGNOSIS — F1020 Alcohol dependence, uncomplicated: Secondary | ICD-10-CM | POA: Diagnosis not present

## 2021-03-11 DIAGNOSIS — Z1283 Encounter for screening for malignant neoplasm of skin: Secondary | ICD-10-CM | POA: Diagnosis not present

## 2021-03-11 DIAGNOSIS — Z85820 Personal history of malignant melanoma of skin: Secondary | ICD-10-CM | POA: Diagnosis not present

## 2021-03-11 DIAGNOSIS — D225 Melanocytic nevi of trunk: Secondary | ICD-10-CM | POA: Diagnosis not present

## 2021-03-11 DIAGNOSIS — Z08 Encounter for follow-up examination after completed treatment for malignant neoplasm: Secondary | ICD-10-CM | POA: Diagnosis not present

## 2021-06-12 DIAGNOSIS — F10120 Alcohol abuse with intoxication, uncomplicated: Secondary | ICD-10-CM | POA: Diagnosis not present

## 2021-06-12 DIAGNOSIS — F339 Major depressive disorder, recurrent, unspecified: Secondary | ICD-10-CM | POA: Diagnosis not present

## 2021-06-12 DIAGNOSIS — F411 Generalized anxiety disorder: Secondary | ICD-10-CM | POA: Diagnosis not present

## 2021-06-17 DIAGNOSIS — A084 Viral intestinal infection, unspecified: Secondary | ICD-10-CM | POA: Diagnosis not present
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
## 2021-06-30 DIAGNOSIS — F1020 Alcohol dependence, uncomplicated: Secondary | ICD-10-CM | POA: Diagnosis not present

## 2021-07-02 DIAGNOSIS — F1020 Alcohol dependence, uncomplicated: Secondary | ICD-10-CM | POA: Diagnosis not present

## 2021-07-04 DIAGNOSIS — F1020 Alcohol dependence, uncomplicated: Secondary | ICD-10-CM | POA: Diagnosis not present

## 2021-07-07 DIAGNOSIS — F1020 Alcohol dependence, uncomplicated: Secondary | ICD-10-CM | POA: Diagnosis not present

## 2021-07-09 DIAGNOSIS — F1020 Alcohol dependence, uncomplicated: Secondary | ICD-10-CM | POA: Diagnosis not present

## 2021-07-11 DIAGNOSIS — F1020 Alcohol dependence, uncomplicated: Secondary | ICD-10-CM | POA: Diagnosis not present

## 2021-07-14 DIAGNOSIS — F1020 Alcohol dependence, uncomplicated: Secondary | ICD-10-CM | POA: Diagnosis not present

## 2021-07-15 DIAGNOSIS — Z1283 Encounter for screening for malignant neoplasm of skin: Secondary | ICD-10-CM | POA: Diagnosis not present

## 2021-07-15 DIAGNOSIS — Z08 Encounter for follow-up examination after completed treatment for malignant neoplasm: Secondary | ICD-10-CM | POA: Diagnosis not present

## 2021-07-15 DIAGNOSIS — D225 Melanocytic nevi of trunk: Secondary | ICD-10-CM | POA: Diagnosis not present

## 2021-07-15 DIAGNOSIS — Z85820 Personal history of malignant melanoma of skin: Secondary | ICD-10-CM | POA: Diagnosis not present

## 2021-07-16 DIAGNOSIS — F1020 Alcohol dependence, uncomplicated: Secondary | ICD-10-CM | POA: Diagnosis not present

## 2021-07-18 DIAGNOSIS — F1020 Alcohol dependence, uncomplicated: Secondary | ICD-10-CM | POA: Diagnosis not present

## 2021-07-21 DIAGNOSIS — F1020 Alcohol dependence, uncomplicated: Secondary | ICD-10-CM | POA: Diagnosis not present

## 2021-07-23 DIAGNOSIS — F1020 Alcohol dependence, uncomplicated: Secondary | ICD-10-CM | POA: Diagnosis not present

## 2021-07-25 DIAGNOSIS — F1020 Alcohol dependence, uncomplicated: Secondary | ICD-10-CM | POA: Diagnosis not present

## 2021-07-28 DIAGNOSIS — F1020 Alcohol dependence, uncomplicated: Secondary | ICD-10-CM | POA: Diagnosis not present

## 2021-07-30 DIAGNOSIS — F1020 Alcohol dependence, uncomplicated: Secondary | ICD-10-CM | POA: Diagnosis not present

## 2021-08-01 DIAGNOSIS — F1020 Alcohol dependence, uncomplicated: Secondary | ICD-10-CM | POA: Diagnosis not present

## 2021-08-04 DIAGNOSIS — F1020 Alcohol dependence, uncomplicated: Secondary | ICD-10-CM | POA: Diagnosis not present

## 2021-08-05 DIAGNOSIS — L988 Other specified disorders of the skin and subcutaneous tissue: Secondary | ICD-10-CM | POA: Diagnosis not present

## 2021-08-05 DIAGNOSIS — D485 Neoplasm of uncertain behavior of skin: Secondary | ICD-10-CM | POA: Diagnosis not present

## 2021-08-06 DIAGNOSIS — F1020 Alcohol dependence, uncomplicated: Secondary | ICD-10-CM | POA: Diagnosis not present

## 2021-08-08 DIAGNOSIS — F1020 Alcohol dependence, uncomplicated: Secondary | ICD-10-CM | POA: Diagnosis not present

## 2021-08-11 DIAGNOSIS — F1020 Alcohol dependence, uncomplicated: Secondary | ICD-10-CM | POA: Diagnosis not present

## 2021-08-13 DIAGNOSIS — F1020 Alcohol dependence, uncomplicated: Secondary | ICD-10-CM | POA: Diagnosis not present

## 2021-08-15 DIAGNOSIS — F1020 Alcohol dependence, uncomplicated: Secondary | ICD-10-CM | POA: Diagnosis not present

## 2021-08-18 DIAGNOSIS — F1020 Alcohol dependence, uncomplicated: Secondary | ICD-10-CM | POA: Diagnosis not present

## 2021-08-20 DIAGNOSIS — F1020 Alcohol dependence, uncomplicated: Secondary | ICD-10-CM | POA: Diagnosis not present

## 2021-08-22 DIAGNOSIS — F1020 Alcohol dependence, uncomplicated: Secondary | ICD-10-CM | POA: Diagnosis not present

## 2021-08-25 DIAGNOSIS — F1020 Alcohol dependence, uncomplicated: Secondary | ICD-10-CM | POA: Diagnosis not present

## 2021-10-22 DIAGNOSIS — F1020 Alcohol dependence, uncomplicated: Secondary | ICD-10-CM | POA: Diagnosis not present

## 2021-10-27 DIAGNOSIS — Z125 Encounter for screening for malignant neoplasm of prostate: Secondary | ICD-10-CM | POA: Diagnosis not present

## 2021-10-27 DIAGNOSIS — R7309 Other abnormal glucose: Secondary | ICD-10-CM | POA: Diagnosis not present

## 2021-10-27 DIAGNOSIS — I10 Essential (primary) hypertension: Secondary | ICD-10-CM | POA: Diagnosis not present

## 2021-10-27 DIAGNOSIS — R361 Hematospermia: Secondary | ICD-10-CM | POA: Diagnosis not present

## 2021-10-29 DIAGNOSIS — F1020 Alcohol dependence, uncomplicated: Secondary | ICD-10-CM | POA: Diagnosis not present

## 2021-10-31 DIAGNOSIS — F1020 Alcohol dependence, uncomplicated: Secondary | ICD-10-CM | POA: Diagnosis not present

## 2021-11-03 DIAGNOSIS — F1020 Alcohol dependence, uncomplicated: Secondary | ICD-10-CM | POA: Diagnosis not present

## 2021-11-05 DIAGNOSIS — F1020 Alcohol dependence, uncomplicated: Secondary | ICD-10-CM | POA: Diagnosis not present

## 2021-11-07 DIAGNOSIS — F1020 Alcohol dependence, uncomplicated: Secondary | ICD-10-CM | POA: Diagnosis not present

## 2021-11-10 DIAGNOSIS — F1020 Alcohol dependence, uncomplicated: Secondary | ICD-10-CM | POA: Diagnosis not present

## 2021-11-12 DIAGNOSIS — F1020 Alcohol dependence, uncomplicated: Secondary | ICD-10-CM | POA: Diagnosis not present

## 2021-11-17 DIAGNOSIS — F1020 Alcohol dependence, uncomplicated: Secondary | ICD-10-CM | POA: Diagnosis not present

## 2021-11-24 DIAGNOSIS — F1020 Alcohol dependence, uncomplicated: Secondary | ICD-10-CM | POA: Diagnosis not present

## 2021-11-28 DIAGNOSIS — F1020 Alcohol dependence, uncomplicated: Secondary | ICD-10-CM | POA: Diagnosis not present

## 2021-12-01 DIAGNOSIS — F1020 Alcohol dependence, uncomplicated: Secondary | ICD-10-CM | POA: Diagnosis not present

## 2021-12-03 DIAGNOSIS — D225 Melanocytic nevi of trunk: Secondary | ICD-10-CM | POA: Diagnosis not present

## 2021-12-03 DIAGNOSIS — D485 Neoplasm of uncertain behavior of skin: Secondary | ICD-10-CM | POA: Diagnosis not present

## 2021-12-03 DIAGNOSIS — Z08 Encounter for follow-up examination after completed treatment for malignant neoplasm: Secondary | ICD-10-CM | POA: Diagnosis not present

## 2021-12-03 DIAGNOSIS — Z85820 Personal history of malignant melanoma of skin: Secondary | ICD-10-CM | POA: Diagnosis not present

## 2022-03-03 ENCOUNTER — Encounter: Payer: Self-pay | Admitting: Family Medicine

## 2022-03-03 ENCOUNTER — Ambulatory Visit (INDEPENDENT_AMBULATORY_CARE_PROVIDER_SITE_OTHER): Payer: Medicare Other | Admitting: Family Medicine

## 2022-03-03 VITALS — BP 136/91 | HR 79 | Ht 65.75 in | Wt 167.0 lb

## 2022-03-03 DIAGNOSIS — I10 Essential (primary) hypertension: Secondary | ICD-10-CM | POA: Diagnosis not present

## 2022-03-03 NOTE — Progress Notes (Signed)
New Patient Office Visit  Subjective    Patient ID: Johnny Newton, male    DOB: 1955-11-29  Age: 66 y.o. MRN: [id number]  CC:  Chief Complaint  Patient presents with   Establish Care    HPI Johnny Newton presents to establish care. He has a pmh of anxiety/MDD as well as HTN. He is well controlled on metoprolol-succinate for his HTN. Admits to controlled anxiety symptoms on zoloft.   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 He is doing well with coping with this and says he has a good system and team in place.   Dermatology: had melanoma removed two years ago; goes for yearly skin checks   Outpatient Encounter Medications as of 03/03/2022  Medication Sig   metoprolol succinate (TOPROL-XL) 50 MG 24 hr tablet Take 1 tablet (50 mg total) by mouth daily. For high blood pressure   sertraline (ZOLOFT) 100 MG tablet Take 1 tablet (100 mg total) by mouth daily. For depression   No facility-administered encounter medications on file as of 03/03/2022.    Past Medical History:  Diagnosis Date   Depression    Hypertension     No past surgical history on file.  Family History  Family history unknown: Yes    Social History   Socioeconomic History   Marital status: Married    Spouse name: Not on file   Number of children: Not on file   Years of education: Not on file   Highest education level: Not on file  Occupational History   Not on file  Tobacco Use   Smoking status: Never   Smokeless tobacco: Never  Vaping Use   Vaping Use: Never used  Substance and Sexual Activity   Alcohol use: Yes    Alcohol/week: 12.0 - 19.0 standard drinks of alcohol    Types: 5 - 7 Glasses of wine, 7 - 12 Cans of beer per week    Comment: per family 'he's an alcoholic'   Drug use: No   Sexual activity: Not Currently  Other Topics Concern   Not on file  Social History Narrative   Not on file   Social Determinants of Health   Financial Resource  Strain: Not on file  Food Insecurity: Not on file  Transportation Needs: Not on file  Physical Activity: Not on file  Stress: Not on file  Social Connections: Not on file  Intimate Partner Violence: Not on file    Review of Systems  Constitutional:  Negative for chills and fever.  Respiratory:  Negative for cough and shortness of breath.   Cardiovascular:  Negative for chest pain.  Neurological:  Negative for headaches.        Objective    BP (!) 139/104   Pulse 73   Ht 5' 5.75" (1.67 m)   Wt 167 lb (75.8 kg)   SpO2 100%   BMI 27.16 kg/m   Physical Exam Vitals reviewed.  Constitutional:      Appearance: He is well-developed.  HENT:     Head: Normocephalic and atraumatic.  Eyes:     Conjunctiva/sclera: Conjunctivae normal.  Cardiovascular:     Rate and Rhythm: Normal rate.  Pulmonary:     Effort: Pulmonary effort is normal.  Skin:    General: Skin is dry.     Coloration: Skin is not pale.  Neurological:     Mental Status: He is alert and oriented to person, place, and  time.  Psychiatric:        Behavior: Behavior normal.        Assessment & Plan:   Problem List Items Addressed This Visit       Cardiovascular and Mediastinum   Primary hypertension - Primary    - BP elevated today as patient was confused and thought this was just a meet and greet visit and did not realize he was needing vitals  - at next visit we will monitor vital signs        Return in about 4 months (around 07/02/2022).   Owens Loffler, DO

## 2022-03-03 NOTE — Assessment & Plan Note (Addendum)
-   BP elevated today as patient was confused and thought this was just a meet and greet visit and did not realize he was needing vitals. He did not want vitals repeated - at next visit we will monitor vital signs

## 2022-03-09 ENCOUNTER — Encounter: Payer: Self-pay | Admitting: Family Medicine

## 2022-06-01 ENCOUNTER — Encounter: Payer: Self-pay | Admitting: Physician Assistant

## 2022-06-01 ENCOUNTER — Ambulatory Visit (INDEPENDENT_AMBULATORY_CARE_PROVIDER_SITE_OTHER): Payer: Medicare Other | Admitting: Physician Assistant

## 2022-06-01 VITALS — BP 136/72 | HR 116 | Ht 65.75 in | Wt 157.0 lb

## 2022-06-01 DIAGNOSIS — F339 Major depressive disorder, recurrent, unspecified: Secondary | ICD-10-CM

## 2022-06-01 DIAGNOSIS — F10982 Alcohol use, unspecified with alcohol-induced sleep disorder: Secondary | ICD-10-CM

## 2022-06-01 DIAGNOSIS — F419 Anxiety disorder, unspecified: Secondary | ICD-10-CM | POA: Diagnosis not present

## 2022-06-01 DIAGNOSIS — F10220 Alcohol dependence with intoxication, uncomplicated: Secondary | ICD-10-CM

## 2022-06-01 NOTE — Patient Instructions (Signed)
Alcohol Intoxication Alcohol intoxication occurs when a person no longer thinks clearly or functions well after drinking alcohol. This is also referred to as becoming impaired. Intoxication can occur with just one drink. The legal definition of alcohol intoxication depends on the amount of alcohol in the blood (blood alcohol concentration, BAC). BAC of 80-100 mg/dL or higher is commonly considered legally intoxicated. The level of impairment depends on: The amount of alcohol the person had. The person's age, gender, and weight. How often the person drinks. Whether the person has other medical conditions, such as diabetes, seizures, or a heart condition. Alcohol intoxication can range from mild to severe. The condition can be dangerous, especially if the person: Also took certain drugs or prescription medicines. Drinks a large amount of alcohol in a short period of time (binge drinks). For women, binge drinking is having four or more drinks at one time. For men, binge drinking is having five or more drinks at one time. If you or anyone around you appears intoxicated, speak up and act. What are the causes? This condition is caused by drinking alcohol. What increases the risk? The following factors may make you more likely to develop this condition: Peer pressure in young adults. Difficulty managing stress. History of drug or alcohol abuse. Family history of drug or alcohol abuse. Combining alcohol with drugs. Low body weight. Binge drinking. What are the signs or symptoms? Symptoms of alcohol intoxication can vary from person to person. Symptoms can be mild, moderate, or severe. Symptoms of mild alcohol intoxication may include: Feeling relaxed or sleepy. Having mild difficulty with coordination, speech, memory, or attention. Symptoms of moderate alcohol intoxication may include: Strong anger or extreme sadness. Moderate difficulty with coordination, speech, memory, or  attention. Symptoms of severe alcohol intoxication may include: Severe difficulty with coordination, speech, memory, or attention. Passing out. Vomiting. Confusion. Slow breathing. Coma. Intoxication can change quickly from mild to severe. It can cause coma or death, especially in people who do not drink alcohol often. How is this diagnosed? Your health care provider will ask you how much alcohol you drank and what kind you had. Intoxication may also be diagnosed based on: Your symptoms and medical history. A physical exam. A blood test that measures BAC. A smell of alcohol on your breath. How is this treated? Treatment for alcohol intoxication may include: Being monitored in an emergency department, hospital, or treatment center until your [facility] comes down and it is safe for you to go home. IV fluids to prevent or treat loss of fluid in the body (dehydration). Medicine to treat nausea or vomiting or to get rid of alcohol in the body. Counseling about the dangers of using alcohol. Treatment for substance use disorder. Oxygen therapy or a breathing machine (ventilator). Drinking alcohol for a long time can have long-term effects on your brain, heart, and digestive system. These effects can be serious and may also require treatment. Follow these instructions at home:  Eating and drinking  Do not drink alcohol if: Your health care provider tells you not to drink. You are pregnant, may be pregnant, or are planning to become pregnant. You are under the legal drinking age, or under 41 years old in the [place] are taking medicines that should not be taken with alcohol. You have a medical condition, and alcohol makes it worse. You need to drive or perform activities that require you to be alert. You have substance use disorder. Ask your health care provider if alcohol is safe  for you. If your health care provider allows you to drink alcohol, limit how much you have to: 0-1 drink a day for  women who are not pregnant. 0-2 drinks a day for men. Know how much alcohol is in your drink. In the [place] one drink equals one 12 oz bottle of beer (355 mL), one 5 oz glass of wine (148 mL), or one 1 oz glass of hard liquor (44 mL). Avoid drinking alcohol on an empty stomach. Alcohol increases urination. It is important to stay hydrated and avoid caffeine. Avoid drinking more than one drink per hour. When having multiple drinks, drink water or a non-alcoholic beverage between alcoholic drinks. General instructions Take over-the-counter and prescription medicines only as told by your health care provider. Do not drive after drinking any amount of alcohol. Plan for a designated driver or another way to go home. Have someone responsible stay with you while you are intoxicated. You should notbe left alone. Contact a health care provider if: You do not feel better after a few days. You have problems at work, at school, or at home due to drinking. Get help right away if: You have any of the following: Trouble staying awake. Moderate to severe trouble with coordination, speech, memory, or attention. You are told you may have had a seizure. Vomiting bright red blood or material that looks like coffee grounds. Bloody stool (feces). The blood may make your stool bright red, black, or tarry. These symptoms may be an emergency. Get help right away. Call 911. Do not wait to see if the symptoms will go away. Do not drive yourself to the hospital. Also, get help right away if: You have thoughts about hurting yourself or others. Take one of these steps if you feel like you may hurt yourself or others, or have thoughts about taking your own life: Call 911. Call the [facility] at [phone] or 988. This is open 24 hours a day. Text the Crisis Text Line at 825-019-5603. Summary Alcohol intoxication occurs when a person no longer thinks clearly or functions well after drinking  alcohol. Ask your health care provider if alcohol is safe for you. If your health care provider allows you to drink alcohol, limit how much you have. Contact your health care provider if drinking has caused you problems at work, school, or home. Get help right away if you have thoughts about hurting yourself or others. This information is not intended to replace advice given to you by your health care provider. Make sure you discuss any questions you have with your health care provider. Document Revised: 06/23/2021 Document Reviewed: 06/23/2021 Elsevier Patient Education  [place].

## 2022-06-01 NOTE — Progress Notes (Signed)
Acute Office Visit  Subjective:     Patient ID: Johnny Newton, male    DOB: Jun 10, 1955, 67 y.o.   MRN: [id number]  Chief Complaint  Patient presents with   Depression    HPI Patient is in today with his wife for depression and alcohol abuse. Pt has had years of alcohol abuse and in and out of rehab. Last rehab was at [facility] and he stayed sober about 4 months. He has hx of acute encephalopathy due to alcohol abuse. He reports "only drinking on the weekend" and "12 beers over 2 days". He is very anxious and feeling hopeless. He got a DUI a few weeks and cannot drive. He denies any hallucinations. He does have paranoid thoughts. He was on zoloft but did not feel like it was helping. He denies a plan to hurt himself but does endorse feelings that he would be better off dead. He gets very angry and easily irritated.   2021 last hospital visit for suicidal ideation.   His wife does not know how much her husband drinks as she does not see him drink ever. He does it away from her. She has a caregiver because of her strokes and some memory loss.   Pt does "want to get help and stop drinking".   .. Active Ambulatory Problems    Diagnosis Date Noted   Acute encephalopathy 09/23/2016   Suicidal ideation 09/23/2016   Rhabdomyolysis 09/23/2016   Normochromic normocytic anemia 09/23/2016   ETOH abuse 09/23/2016   Depression 09/23/2016   Dehydration    Alcohol dependence with alcohol-induced mood disorder ([place]) 09/23/2016   Toxic encephalopathy 09/24/2016   Alcohol use disorder, severe, dependence ([place]) 09/29/2016   Cannabis use disorder, severe, dependence ([place]) 09/29/2016   Major depressive disorder, recurrent episode with anxious distress ([place]) 09/29/2016   MDD (major depressive disorder), severe ([place]) 11/28/2019   Primary hypertension 03/03/2022   Alcohol-induced insomnia ([place]) 06/01/2022   Anxiety 06/01/2022   Resolved Ambulatory Problems    Diagnosis Date Noted   MDD  (major depressive disorder) 09/25/2016   Past Medical History:  Diagnosis Date   Hypertension    Melanoma ([facility])     ROS See HPI.      Objective:    BP 136/72   Pulse (!) 116   Ht 5' 5.75" (1.67 m)   Wt 157 lb (71.2 kg)   SpO2 100%   BMI 25.53 kg/m    .Marland Kitchen    06/01/2022   10:09 AM 03/03/2022    4:47 PM 11/10/2019    4:28 AM  Depression screen PHQ 2/9  Decreased Interest 3 0 0  Down, Depressed, Hopeless 3 0 1  PHQ - 2 Score 6 0 1  Altered sleeping 3 0 1  Tired, decreased energy 3 0 1  Change in appetite 3 0 1  Feeling bad or failure about yourself  3 0 1  Trouble concentrating 3 0 1  Moving slowly or fidgety/restless 3 0 0  Suicidal thoughts 3 0 0  PHQ-9 Score 27 0 6  Difficult doing work/chores Extremely dIfficult Not difficult at all Somewhat difficult   ..    06/01/2022   10:09 AM 03/09/2022    9:48 AM  GAD 7 : Generalized Anxiety Score  Nervous, Anxious, on Edge 3 0  Control/stop worrying 3 0  Worry too much - different things 3 0  Trouble relaxing 3 0  Restless 3 0  Easily annoyed or irritable 3 0  Afraid -  awful might happen 3 0  Total GAD 7 Score 21 0  Anxiety Difficulty Extremely difficult    MDQ 9/13  Physical Exam Constitutional:      General: He is in acute distress.     Appearance: He is ill-appearing.  HENT:     Head: Normocephalic.     Right Ear: Tympanic membrane normal.     Left Ear: Tympanic membrane normal.     Nose: Nose normal.     Mouth/Throat:     Mouth: Mucous membranes are moist.     Pharynx: No oropharyngeal exudate or posterior oropharyngeal erythema.  Eyes:     Conjunctiva/sclera: Conjunctivae normal.  Cardiovascular:     Rate and Rhythm: Regular rhythm. Tachycardia present.     Pulses: Normal pulses.     Heart sounds: No murmur heard. Pulmonary:     Effort: Pulmonary effort is normal.     Breath sounds: Normal breath sounds.  Musculoskeletal:     Right lower leg: No edema.     Left lower leg: No edema.  Skin:     General: Skin is warm.  Neurological:     General: No focal deficit present.     Mental Status: He is oriented to person, place, and time.     Motor: Weakness present.     Coordination: Coordination abnormal.     Gait: Gait abnormal.     Comments: Not able to walk without assistance.   Psychiatric:     Comments: Abnormal behavior, not able to make eye contact, awake talking then very sleepy         Assessment & Plan:  Marland KitchenMarland KitchenDanger was seen today for depression.  Diagnoses and all orders for this visit:  Alcohol dependence with uncomplicated intoxication ([place])  Anxiety  Alcohol-induced insomnia ([place])  Major depressive disorder, recurrent episode with anxious distress ([place])   Called sister to confirm baseline Pt appears intoxicated today and not able to make sound decisions Not able to collect UDS today Wife does not feel like she can safely bring him home Goal is to get him into rehab He needs detox before going to rehab EMS called to take to [facility] Pt did eventually go on his own will   PHQ/GAD are elevated but no medications started and will defer to hospital   Spent 58 minutes with patient and patient family discussing treatment plan, calling EMS and coordinating care.    Iran Planas, PA-C

## 2022-08-19 DEATH — deceased

## 2022-09-01 ENCOUNTER — Ambulatory Visit: Payer: Medicare Other | Admitting: Family Medicine
